# Patient Record
Sex: Female | Born: 1980
Health system: Southern US, Community
[De-identification: ages and names within clinical notes are randomized; demographics above are authoritative.]

## PROBLEM LIST (undated history)

## (undated) DIAGNOSIS — F419 Anxiety disorder, unspecified: Secondary | ICD-10-CM

## (undated) DIAGNOSIS — N200 Calculus of kidney: Secondary | ICD-10-CM

## (undated) DIAGNOSIS — I10 Essential (primary) hypertension: Secondary | ICD-10-CM

## (undated) DIAGNOSIS — F32A Depression, unspecified: Secondary | ICD-10-CM

## (undated) HISTORY — DX: Anxiety disorder, unspecified: F41.9

## (undated) HISTORY — DX: Depression, unspecified: F32.A

## (undated) HISTORY — DX: Calculus of kidney: N20.0

---
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
## 1998-04-13 HISTORY — PX: LITHOTRIPSY: SUR834

## 1998-06-13 ENCOUNTER — Emergency Department (HOSPITAL_COMMUNITY): Admission: EM | Admit: 1998-06-13 | Discharge: 1998-06-13 | Payer: Self-pay | Admitting: Emergency Medicine

## 1998-06-13 ENCOUNTER — Encounter: Payer: Self-pay | Admitting: Emergency Medicine

## 1998-09-16 ENCOUNTER — Other Ambulatory Visit: Admission: RE | Admit: 1998-09-16 | Discharge: 1998-09-16 | Payer: Self-pay | Admitting: Obstetrics and Gynecology

## 2000-01-09 ENCOUNTER — Emergency Department (HOSPITAL_COMMUNITY): Admission: EM | Admit: 2000-01-09 | Discharge: 2000-01-09 | Payer: Self-pay | Admitting: Emergency Medicine

## 2000-03-26 ENCOUNTER — Inpatient Hospital Stay (HOSPITAL_COMMUNITY): Admission: AD | Admit: 2000-03-26 | Discharge: 2000-03-26 | Payer: Self-pay | Admitting: Obstetrics and Gynecology

## 2000-03-27 ENCOUNTER — Inpatient Hospital Stay (HOSPITAL_COMMUNITY): Admission: AD | Admit: 2000-03-27 | Discharge: 2000-03-29 | Payer: Self-pay | Admitting: Obstetrics and Gynecology

## 2000-04-28 ENCOUNTER — Other Ambulatory Visit: Admission: RE | Admit: 2000-04-28 | Discharge: 2000-04-28 | Payer: Self-pay | Admitting: Obstetrics and Gynecology

## 2000-10-10 ENCOUNTER — Emergency Department (HOSPITAL_COMMUNITY): Admission: EM | Admit: 2000-10-10 | Discharge: 2000-10-10 | Payer: Self-pay | Admitting: Emergency Medicine

## 2000-12-12 ENCOUNTER — Emergency Department (HOSPITAL_COMMUNITY): Admission: EM | Admit: 2000-12-12 | Discharge: 2000-12-12 | Payer: Self-pay | Admitting: Emergency Medicine

## 2001-04-19 ENCOUNTER — Other Ambulatory Visit: Admission: RE | Admit: 2001-04-19 | Discharge: 2001-04-19 | Payer: Self-pay | Admitting: Obstetrics and Gynecology

## 2001-07-27 ENCOUNTER — Encounter: Admission: RE | Admit: 2001-07-27 | Discharge: 2001-07-27 | Payer: Self-pay | Admitting: Urology

## 2001-07-27 ENCOUNTER — Encounter: Payer: Self-pay | Admitting: Urology

## 2001-08-22 ENCOUNTER — Encounter: Payer: Self-pay | Admitting: Urology

## 2001-08-22 ENCOUNTER — Ambulatory Visit (HOSPITAL_BASED_OUTPATIENT_CLINIC_OR_DEPARTMENT_OTHER): Admission: RE | Admit: 2001-08-22 | Discharge: 2001-08-22 | Payer: Self-pay | Admitting: Urology

## 2001-09-08 ENCOUNTER — Encounter: Payer: Self-pay | Admitting: Urology

## 2001-09-08 ENCOUNTER — Encounter: Admission: RE | Admit: 2001-09-08 | Discharge: 2001-09-08 | Payer: Self-pay | Admitting: Urology

## 2002-05-17 ENCOUNTER — Other Ambulatory Visit: Admission: RE | Admit: 2002-05-17 | Discharge: 2002-05-17 | Payer: Self-pay | Admitting: Obstetrics and Gynecology

## 2003-06-27 ENCOUNTER — Other Ambulatory Visit: Admission: RE | Admit: 2003-06-27 | Discharge: 2003-06-27 | Payer: Self-pay | Admitting: Obstetrics and Gynecology

## 2003-09-26 ENCOUNTER — Emergency Department (HOSPITAL_COMMUNITY): Admission: EM | Admit: 2003-09-26 | Discharge: 2003-09-26 | Payer: Self-pay | Admitting: Family Medicine

## 2004-08-13 ENCOUNTER — Other Ambulatory Visit: Admission: RE | Admit: 2004-08-13 | Discharge: 2004-08-13 | Payer: Self-pay | Admitting: Obstetrics and Gynecology

## 2005-03-11 ENCOUNTER — Other Ambulatory Visit: Admission: RE | Admit: 2005-03-11 | Discharge: 2005-03-11 | Payer: Self-pay | Admitting: Obstetrics and Gynecology

## 2007-04-14 HISTORY — PX: INTRAUTERINE DEVICE INSERTION: SHX323

## 2007-08-07 ENCOUNTER — Inpatient Hospital Stay (HOSPITAL_COMMUNITY): Admission: AD | Admit: 2007-08-07 | Discharge: 2007-08-09 | Payer: Self-pay | Admitting: Obstetrics and Gynecology

## 2007-08-31 ENCOUNTER — Ambulatory Visit: Admission: RE | Admit: 2007-08-31 | Discharge: 2007-08-31 | Payer: Self-pay | Admitting: Obstetrics and Gynecology

## 2010-07-22 ENCOUNTER — Ambulatory Visit (HOSPITAL_COMMUNITY)
Admission: RE | Admit: 2010-07-22 | Discharge: 2010-07-22 | Disposition: A | Discharge status: Home / Self Care | Payer: 59 | Attending: Psychiatry | Admitting: Psychiatry

## 2010-07-22 DIAGNOSIS — F39 Unspecified mood [affective] disorder: Secondary | ICD-10-CM | POA: Insufficient documentation

## 2011-01-06 LAB — CBC
HCT: 33.2 — ABNORMAL LOW
HCT: 40
Hemoglobin: 11.7 — ABNORMAL LOW
Hemoglobin: 14
MCHC: 35
MCHC: 35.2
MCV: 93.9
MCV: 94.4
Platelets: 120 — ABNORMAL LOW
Platelets: 129 — ABNORMAL LOW
RBC: 3.52 — ABNORMAL LOW
RBC: 4.26
RDW: 13
RDW: 13
WBC: 11.4 — ABNORMAL HIGH
WBC: 12.9 — ABNORMAL HIGH

## 2011-01-06 LAB — RPR: RPR Ser Ql: NONREACTIVE

## 2011-04-01 ENCOUNTER — Ambulatory Visit (INDEPENDENT_AMBULATORY_CARE_PROVIDER_SITE_OTHER): Payer: BC Managed Care – PPO

## 2011-04-01 DIAGNOSIS — B9789 Other viral agents as the cause of diseases classified elsewhere: Secondary | ICD-10-CM

## 2011-04-01 DIAGNOSIS — J029 Acute pharyngitis, unspecified: Secondary | ICD-10-CM

## 2011-07-21 ENCOUNTER — Encounter (INDEPENDENT_AMBULATORY_CARE_PROVIDER_SITE_OTHER): Payer: Self-pay | Admitting: Surgery

## 2011-07-30 ENCOUNTER — Encounter (INDEPENDENT_AMBULATORY_CARE_PROVIDER_SITE_OTHER): Payer: Self-pay | Admitting: Surgery

## 2011-07-30 ENCOUNTER — Ambulatory Visit (INDEPENDENT_AMBULATORY_CARE_PROVIDER_SITE_OTHER): Payer: BC Managed Care – PPO | Admitting: Surgery

## 2011-07-30 VITALS — BP 124/78 | HR 78 | Temp 98.6°F | Resp 16 | Ht 71.0 in | Wt 202.0 lb

## 2011-07-30 DIAGNOSIS — L723 Sebaceous cyst: Secondary | ICD-10-CM | POA: Insufficient documentation

## 2011-07-30 DIAGNOSIS — L089 Local infection of the skin and subcutaneous tissue, unspecified: Secondary | ICD-10-CM | POA: Insufficient documentation

## 2011-07-30 NOTE — Progress Notes (Signed)
Patient ID: Stacy Bray, female   DOB: [birth year], 31 y.o.   MRN: [id number]  No chief complaint on file.   HPI Stacy Bray is a 31 y.o. female.  Pt referred by Cher Nakai at [facility] for neck mass and scalp lesion HPI 31 yo female in good health presents with 5 month history of a slowly enlarging skin lesion on her scalp and a 1 year history of an enlarging mass on her neck.  Neither have been infected or inflamed.  The neck mass is just under the skin and is fairly mobile.  No past medical history on file.  Past Surgical History  Procedure Date  . Lithotripsy 2000    kidney stones  . Intrauterine device insertion 2009    Family History  Problem Relation Age of Onset  . Cancer Mother     colon cancer  . Cancer Maternal Aunt     melanoma, bladder cancer  . Cancer Maternal Grandmother     Breast Cancer  . Cancer Maternal Grandfather     colon cancer    Social History History  Substance Use Topics  . Smoking status: Former Smoker -- 1.0 packs/day    Types: Cigarettes    Quit date: 05/01/2011  . Smokeless tobacco: Not on file  . Alcohol Use: Yes    No Known Allergies  No current outpatient prescriptions on file.    Review of Systems Review of Systems ROS - otherwise negative Blood pressure 124/78, pulse 78, temperature 98.6 F (37 C), temperature source Temporal, resp. rate 16, height 5\' 11"  (1.803 m), weight 202 lb (91.627 kg).  Physical Exam Physical Exam WDWN in NAD Scalp - just to the left of midline on the crown of her scalp, there is a whitish papular skin lesion.  No drainage, crusting, or inflammation Left neck - at base - 1 cm subcutaneous mass; non-inflamed; no drainage  Data Reviewed none  Assessment    1.  Sebaceous cyst - left neck  2.  Skin lesion - scalp    Plan    Excision of sebaceous cyst and skin biopsy in the office.  The surgical procedure has been discussed with the patient.  Potential risks, benefits,  alternative treatments, and expected outcomes have been explained.  All of the patient's questions at this time have been answered.  The likelihood of reaching the patient's treatment goal is good.  The patient understand the proposed surgical procedure and wishes to proceed.        TSUEI,MATTHEW K. 07/30/2011, 4:22 PM

## 2011-08-12 ENCOUNTER — Encounter (INDEPENDENT_AMBULATORY_CARE_PROVIDER_SITE_OTHER): Payer: Self-pay | Admitting: Surgery

## 2011-08-12 ENCOUNTER — Ambulatory Visit (INDEPENDENT_AMBULATORY_CARE_PROVIDER_SITE_OTHER): Payer: BC Managed Care – PPO | Admitting: Surgery

## 2011-08-12 VITALS — BP 117/85 | HR 89 | Temp 98.2°F | Ht 71.0 in | Wt 198.8 lb

## 2011-08-12 DIAGNOSIS — D234 Other benign neoplasm of skin of scalp and neck: Secondary | ICD-10-CM

## 2011-08-12 DIAGNOSIS — L989 Disorder of the skin and subcutaneous tissue, unspecified: Secondary | ICD-10-CM | POA: Insufficient documentation

## 2011-08-12 DIAGNOSIS — L723 Sebaceous cyst: Secondary | ICD-10-CM

## 2011-08-12 NOTE — Progress Notes (Signed)
The patient presents for excision of a sebaceous cyst on the left side of her neck ( 1 cm) and a skin lesion of the scalp (0.5 cm).  She was positioned in a supine position on the procedure table with her head turned to the right.  Her neck was prepped with Betadine and the skin was anesthetized with 1% lidocaine.  A 1cm incision was made over the mass.  I dissected the sebaceous cyst out completely.  The wound was closed with 4-0 monocryl and sealed with Dermabond.  The scalp skin lesion was prepped with alcohol and anesthetized with lidocaine.  I shaved the lesion off flush with the skin.  Hemostasis was obtained with pressure and silver nitrate.  The skin lesion was sent for pathologic examination.  We will call the patient with her results.  Follow-up PRN.  Wilmon Arms. Corliss Skains, MD, [facility]  08/12/2011 5:34 PM

## 2011-08-17 ENCOUNTER — Telehealth (INDEPENDENT_AMBULATORY_CARE_PROVIDER_SITE_OTHER): Payer: Self-pay | Admitting: General Surgery

## 2011-08-17 NOTE — Telephone Encounter (Signed)
Called pt let her now the results of her path report

## 2011-09-02 ENCOUNTER — Other Ambulatory Visit: Payer: Self-pay | Admitting: Family Medicine

## 2011-11-24 ENCOUNTER — Other Ambulatory Visit: Payer: Self-pay | Admitting: Obstetrics & Gynecology

## 2012-07-21 ENCOUNTER — Other Ambulatory Visit: Payer: Self-pay

## 2014-08-09 ENCOUNTER — Ambulatory Visit (INDEPENDENT_AMBULATORY_CARE_PROVIDER_SITE_OTHER): Payer: 59 | Admitting: Podiatry

## 2014-08-09 ENCOUNTER — Encounter: Payer: Self-pay | Admitting: Podiatry

## 2014-08-09 ENCOUNTER — Ambulatory Visit: Payer: Self-pay

## 2014-08-09 DIAGNOSIS — M2010 Hallux valgus (acquired), unspecified foot: Secondary | ICD-10-CM | POA: Diagnosis not present

## 2014-08-09 DIAGNOSIS — M722 Plantar fascial fibromatosis: Secondary | ICD-10-CM

## 2014-08-09 DIAGNOSIS — B351 Tinea unguium: Secondary | ICD-10-CM | POA: Diagnosis not present

## 2014-08-09 MED ORDER — TERBINAFINE HCL 250 MG PO TABS: 250.0000 mg | ORAL_TABLET | Freq: Every day | ORAL | Status: DC

## 2014-08-09 MED ORDER — DICLOFENAC SODIUM 75 MG PO TBEC: 75.0000 mg | DELAYED_RELEASE_TABLET | Freq: Two times a day (BID) | ORAL | Status: DC

## 2014-08-09 MED ORDER — TRIAMCINOLONE ACETONIDE 10 MG/ML IJ SUSP
10.0000 mg | Freq: Once | INTRAMUSCULAR | Status: AC
  Administered 2014-08-09: 10 mg

## 2014-08-09 NOTE — Progress Notes (Signed)
   Subjective:    Patient ID: [name], female    DOB: 1980/07/20, 34 y.o.   MRN: [id number]  HPI PT STATED LT BOTTOM OF THE HEEL BEEN HURTING FOR FOR 6 MONTHS. FOOT IS GETTING WORSE ESPECIALLY FIRST STEP IN THE MORNING. TRIED STRETCHING AND IT HELP SOME.   Review of Systems  All other systems reviewed and are negative.      Objective:   Physical Exam        Assessment & Plan:

## 2014-08-09 NOTE — Patient Instructions (Signed)
Plantar Fasciitis  Plantar fasciitis is a common condition that causes foot pain. It is soreness (inflammation) of the band of tough fibrous tissue on the bottom of the foot that runs from the heel bone (calcaneus) to the ball of the foot. The cause of this soreness may be from excessive standing, poor fitting shoes, running on hard surfaces, being overweight, having an abnormal walk, or overuse (this is common in runners) of the painful foot or feet. It is also common in aerobic exercise dancers and ballet dancers.  SYMPTOMS   Most people with plantar fasciitis complain of:   Severe pain in the morning on the bottom of their foot especially when taking the first steps out of bed. This pain recedes after a few minutes of walking.   Severe pain is experienced also during walking following a long period of inactivity.   Pain is worse when walking barefoot or up stairs  DIAGNOSIS    Your caregiver will diagnose this condition by examining and feeling your foot.   Special tests such as X-rays of your foot, are usually not needed.  PREVENTION    Consult a sports medicine professional before beginning a new exercise program.   Walking programs offer a good workout. With walking there is a lower chance of overuse injuries common to runners. There is less impact and less jarring of the joints.   Begin all new exercise programs slowly. If problems or pain develop, decrease the amount of time or distance until you are at a comfortable level.   Wear good shoes and replace them regularly.   Stretch your foot and the heel cords at the back of the ankle (Achilles tendon) both before and after exercise.   Run or exercise on even surfaces that are not hard. For example, asphalt is better than pavement.   Do not run barefoot on hard surfaces.   If using a treadmill, vary the incline.   Do not continue to workout if you have foot or joint problems. Seek professional help if they do not improve.  HOME CARE INSTRUCTIONS     Avoid activities that cause you pain until you recover.   Use ice or cold packs on the problem or painful areas after working out.   Only take over-the-counter or prescription medicines for pain, discomfort, or fever as directed by your caregiver.   Soft shoe inserts or athletic shoes with air or gel sole cushions may be helpful.   If problems continue or become more severe, consult a sports medicine caregiver or your own health care provider. Cortisone is a potent anti-inflammatory medication that may be injected into the painful area. You can discuss this treatment with your caregiver.  MAKE SURE YOU:    Understand these instructions.   Will watch your condition.   Will get help right away if you are not doing well or get worse.  Document Released: 12/23/2000 Document Revised: 06/22/2011 Document Reviewed: 02/22/2008  ExitCare Patient Information 2015 [employer]. This information is not intended to replace advice given to you by your health care provider. Make sure you discuss any questions you have with your health care provider.

## 2014-08-10 NOTE — Progress Notes (Signed)
Subjective:     Patient ID: Stacy Bray, female   DOB: 12-23-1980, 34 y.o.   MRN: [id number]  HPI patient states that she's getting a lot of pain in the plantar aspect of her left heel of approximate 6 months duration. It's worse first up in the morning and after sitting. Patient states that it has gotten gradually worse and she also complains of a mild structural bunion deformity right   Review of Systems  All other systems reviewed and are negative.      Objective:   Physical Exam  Constitutional: She is oriented to person, place, and time.  Cardiovascular: Intact distal pulses.   Musculoskeletal: Normal range of motion.  Neurological: She is oriented to person, place, and time.  Skin: Skin is warm.  Nursing note and vitals reviewed.  neurovascular status found to be intact with muscle strength adequate and range of motion subtalar midtarsal joint within normal limits. Patient's noted to have quite a bit of discomfort plantar aspect left heel at the insertion of the tendon into the calcaneus with inflammation and fluid buildup upon palpation. Patient has good digital perfusion is well oriented 3 and on the right foot is noted to have a mild hyperostosis that red when pressed but minimal discomfort     Assessment:     Acute plantar fasciitis left and mild structural bunion deformity right    Plan:     H&P and x-rays reviewed. Today I injected the left plantar fashion 3 mg Kenalog 5 mg Xylocaine and applied fascial brace to lift the arch and placed on diclofenac 75 mg twice a day and gave instructions on physical therapy to patient. Reappoint to recheck in one week

## 2014-11-01 ENCOUNTER — Other Ambulatory Visit: Payer: Self-pay | Admitting: Obstetrics and Gynecology

## 2015-03-11 ENCOUNTER — Encounter: Payer: Self-pay | Admitting: Internal Medicine

## 2015-10-07 ENCOUNTER — Other Ambulatory Visit: Payer: Self-pay | Admitting: Surgery

## 2015-10-07 DIAGNOSIS — R590 Localized enlarged lymph nodes: Secondary | ICD-10-CM | POA: Insufficient documentation

## 2015-10-09 ENCOUNTER — Ambulatory Visit
Admission: RE | Admit: 2015-10-09 | Discharge: 2015-10-09 | Disposition: A | Discharge status: Home / Self Care | Payer: 59 | Source: Ambulatory Visit | Attending: Surgery | Admitting: Surgery

## 2015-10-09 DIAGNOSIS — R590 Localized enlarged lymph nodes: Secondary | ICD-10-CM

## 2015-12-21 ENCOUNTER — Other Ambulatory Visit: Payer: Self-pay | Admitting: General Surgery

## 2015-12-21 DIAGNOSIS — R590 Localized enlarged lymph nodes: Secondary | ICD-10-CM

## 2015-12-31 ENCOUNTER — Ambulatory Visit
Admission: RE | Admit: 2015-12-31 | Discharge: 2015-12-31 | Disposition: A | Discharge status: Home / Self Care | Payer: 59 | Source: Ambulatory Visit | Attending: General Surgery | Admitting: General Surgery

## 2015-12-31 DIAGNOSIS — R590 Localized enlarged lymph nodes: Secondary | ICD-10-CM

## 2016-01-30 ENCOUNTER — Other Ambulatory Visit (HOSPITAL_COMMUNITY)
Admission: RE | Admit: 2016-01-30 | Discharge: 2016-01-30 | Disposition: A | Discharge status: Home / Self Care | Payer: 59 | Source: Ambulatory Visit | Attending: Otolaryngology | Admitting: Otolaryngology

## 2016-01-30 ENCOUNTER — Other Ambulatory Visit: Payer: Self-pay | Admitting: Otolaryngology

## 2016-09-02 DIAGNOSIS — Z01419 Encounter for gynecological examination (general) (routine) without abnormal findings: Secondary | ICD-10-CM | POA: Diagnosis not present

## 2016-09-14 DIAGNOSIS — E669 Obesity, unspecified: Secondary | ICD-10-CM | POA: Insufficient documentation

## 2017-02-17 DIAGNOSIS — A084 Viral intestinal infection, unspecified: Secondary | ICD-10-CM | POA: Diagnosis not present

## 2017-02-17 DIAGNOSIS — R51 Headache: Secondary | ICD-10-CM | POA: Diagnosis not present

## 2017-09-07 ENCOUNTER — Ambulatory Visit (HOSPITAL_COMMUNITY)
Admission: EM | Admit: 2017-09-07 | Discharge: 2017-09-07 | Disposition: A | Discharge status: Home / Self Care | Payer: 59 | Attending: Family Medicine | Admitting: Family Medicine

## 2017-09-07 ENCOUNTER — Encounter (HOSPITAL_COMMUNITY): Payer: Self-pay | Admitting: Emergency Medicine

## 2017-09-07 DIAGNOSIS — G5601 Carpal tunnel syndrome, right upper limb: Secondary | ICD-10-CM

## 2017-09-07 DIAGNOSIS — M7542 Impingement syndrome of left shoulder: Secondary | ICD-10-CM

## 2017-09-07 DIAGNOSIS — M25512 Pain in left shoulder: Secondary | ICD-10-CM

## 2017-09-07 MED ORDER — NAPROXEN 500 MG PO TABS: 500.0000 mg | ORAL_TABLET | Freq: Two times a day (BID) | ORAL | 1 refills | Status: DC

## 2017-09-07 NOTE — Discharge Instructions (Addendum)
Ice to shoulder Naproxen 2 x a day with food Shoulder exercises Need orthopedic if fails to improve

## 2017-09-07 NOTE — ED Triage Notes (Signed)
Pt sts left arm pain with numbness x 1 week

## 2017-09-07 NOTE — ED Provider Notes (Signed)
Stacy Bray    CSN: [id number] Arrival date & time: 09/07/17  1656     History   Chief Complaint Chief Complaint  Patient presents with  . Arm Pain    HPI Stacy Bray is a 37 y.o. female.   HPI Patient is here with 2 complaints.  She states that her right hand is been going numb on and off for months.  She looked it up in a medical reference and feels that she may have carpal tunnel syndrome.  She is been wearing a brace at work to help with this.  She states the pain sometimes awakens her at night.  I told her that it would be more useful to wear the brace at night if she has carpal tunnel syndrome.  She does describe numbness in the thumb index and long fingers that is more bothersome at night than during the day.  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 The other reason she is here, more importantly, as her left shoulder pain.  This is been bothering her for about a week.  She states that she has pain in her shoulder and neck, limited movement of her shoulder, and pain that radiates down to the elbow.  Sometimes she has a heavy sensation or numb sensation in her arm.  No numbness or weakness in the forearm wrist or hands.  No loss of strength.  No loss of dexterity.  No overuse of the shoulder.  No prior problems with the shoulder.  No fall or trauma.  History reviewed. No pertinent past medical history.  Patient Active Problem List   Diagnosis Date Noted  . Left cervical lymphadenopathy 10/07/2015  . Skin lesion of scalp 08/12/2011  . Sebaceous cyst 07/30/2011    Past Surgical History:  Procedure Laterality Date  . INTRAUTERINE DEVICE INSERTION  2009  . LITHOTRIPSY  2000   kidney stones    OB History   None      Home Medications    Prior to Admission medications   Medication Sig Start Date End Date Taking? Authorizing Provider  naproxen (NAPROSYN) 500 MG tablet Take 1 tablet (500 mg  total) by mouth 2 (two) times daily. 09/07/17   Raylene Everts, MD    Family History Family History  Problem Relation Age of Onset  . Cancer Mother        colon cancer  . Cancer Maternal Aunt        melanoma, bladder cancer  . Cancer Maternal Grandmother        Breast Cancer  . Cancer Maternal Grandfather        colon cancer    Social History Social History   Tobacco Use  . Smoking status: Former Smoker    Packs/day: 1.00    Types: Cigarettes    Last attempt to quit: 05/01/2011    Years since quitting: 6.3  Substance Use Topics  . Alcohol use: Yes  . Drug use: No     Allergies   Patient has no known allergies.   Review of Systems Review of Systems  Constitutional: Negative for chills and fever.  HENT: Negative for ear pain and sore throat.   Eyes: Negative for pain and visual disturbance.  Respiratory: Negative for cough and shortness of breath.   Cardiovascular: Negative for chest pain and palpitations.  Gastrointestinal: Negative for abdominal pain and vomiting.  Genitourinary: Negative for dysuria and hematuria.  Musculoskeletal: Positive for  arthralgias. Negative for back pain.  Skin: Negative for color change and rash.  Neurological: Positive for numbness. Negative for seizures and syncope.  Psychiatric/Behavioral: Positive for sleep disturbance.  All other systems reviewed and are negative.    Physical Exam Triage Vital Signs ED Triage Vitals [09/07/17 1744]  Enc Vitals Group     BP 140/78     Pulse Rate 64     Resp 18     Temp 98.8 F (37.1 C)     Temp Source Oral     SpO2 100 %     Weight      Height      Head Circumference      Peak Flow      Pain Score      Pain Loc      Pain Edu?      Excl. in [place]?    No data found.  Updated Vital Signs BP 140/78 (BP Location: Left Arm)   Pulse 64   Temp 98.8 F (37.1 C) (Oral)   Resp 18   SpO2 100%   Visual Acuity Right Eye Distance:   Left Eye Distance:   Bilateral Distance:    Right  Eye Near:   Left Eye Near:    Bilateral Near:     Physical Exam  Constitutional: She appears well-developed and well-nourished. No distress.  HENT:  Head: Normocephalic and atraumatic.  Mouth/Throat: Oropharynx is clear and moist.  Eyes: Pupils are equal, round, and reactive to light. Conjunctivae are normal.  Neck: Normal range of motion.  Cardiovascular: Normal rate.  Pulmonary/Chest: Effort normal. No respiratory distress.  Abdominal: Soft. She exhibits no distension.  Musculoskeletal: Normal range of motion. She exhibits no edema.  Neck is full range of motion.  No tenderness.  Negative Spurling's bilaterally.  Right upper extremity does reveal normal strength sensation range of motion reflexes.  Positive Phalen's at 45 seconds.  Left upper trapezius is mildly tender.  There is tenderness globally around the shoulder joint.  She can abduct to 80 degrees.  Forward flex to 90 degrees.  Pain with external rotation, difficulty reaching behind head.  Mild positive impingement signs.  Elbow wrist and hand exams are normal.  Neurological: She is alert.  Skin: Skin is warm and dry.     UC Treatments / Results  Labs (all labs ordered are listed, but only abnormal results are displayed) Labs Reviewed - No data to display  EKG None  Radiology No results found.  Procedures Procedures (including critical care time)  Medications Ordered in UC Medications - No data to display  Initial Impression / Assessment and Plan / UC Course  I have reviewed the triage vital signs and the nursing notes.  Pertinent labs & imaging results that were available during my care of the patient were reviewed by me and considered in my medical decision making (see chart for details).     Discussed with patient that her symptoms are consistent with early carpal tunnel syndrome on the right.  Reasons for referral are discussed.  At this point she can treat conservatively.  Left shoulder pain is consistent  with shoulder impingement/tendinopathy.  Likely not torn since she has good strength, had no trauma.  She should treat this with anti-inflammatories, rest, ice.  Then range of motion and strengthening.  May need follow-up with orthopedic if fails to improve. Final Clinical Impressions(s) / UC Diagnoses   Final diagnoses:  Nontraumatic shoulder pain, left  Rotator cuff impingement syndrome of  left shoulder  Carpal tunnel syndrome on right     Discharge Instructions     Ice to shoulder Naproxen 2 x a day with food Shoulder exercises Need orthopedic if fails to improve   ED Prescriptions    Medication Sig Dispense Auth. Provider   naproxen (NAPROSYN) 500 MG tablet Take 1 tablet (500 mg total) by mouth 2 (two) times daily. 30 tablet Raylene Everts, MD     Controlled Substance Prescriptions [place] Controlled Substance Registry consulted? Not Applicable   Raylene Everts, MD 09/07/17 2128

## 2017-09-15 DIAGNOSIS — Z01419 Encounter for gynecological examination (general) (routine) without abnormal findings: Secondary | ICD-10-CM | POA: Diagnosis not present

## 2017-09-15 DIAGNOSIS — Z124 Encounter for screening for malignant neoplasm of cervix: Secondary | ICD-10-CM | POA: Diagnosis not present

## 2017-10-19 DIAGNOSIS — Z131 Encounter for screening for diabetes mellitus: Secondary | ICD-10-CM | POA: Diagnosis not present

## 2017-10-19 DIAGNOSIS — Z1329 Encounter for screening for other suspected endocrine disorder: Secondary | ICD-10-CM | POA: Diagnosis not present

## 2017-10-19 DIAGNOSIS — Z13220 Encounter for screening for lipoid disorders: Secondary | ICD-10-CM | POA: Diagnosis not present

## 2017-10-19 DIAGNOSIS — Z3202 Encounter for pregnancy test, result negative: Secondary | ICD-10-CM | POA: Diagnosis not present

## 2017-10-19 DIAGNOSIS — R718 Other abnormality of red blood cells: Secondary | ICD-10-CM | POA: Diagnosis not present

## 2017-11-07 ENCOUNTER — Ambulatory Visit (HOSPITAL_COMMUNITY)
Admission: EM | Admit: 2017-11-07 | Discharge: 2017-11-07 | Disposition: A | Discharge status: Home / Self Care | Payer: 59

## 2017-11-07 ENCOUNTER — Ambulatory Visit (HOSPITAL_COMMUNITY): Payer: 59

## 2017-11-07 ENCOUNTER — Encounter (HOSPITAL_COMMUNITY): Payer: Self-pay | Admitting: Emergency Medicine

## 2017-11-07 ENCOUNTER — Other Ambulatory Visit: Payer: Self-pay

## 2017-11-07 ENCOUNTER — Ambulatory Visit (INDEPENDENT_AMBULATORY_CARE_PROVIDER_SITE_OTHER): Payer: 59

## 2017-11-07 DIAGNOSIS — R2241 Localized swelling, mass and lump, right lower limb: Secondary | ICD-10-CM

## 2017-11-07 DIAGNOSIS — S92351A Displaced fracture of fifth metatarsal bone, right foot, initial encounter for closed fracture: Secondary | ICD-10-CM

## 2017-11-07 MED ORDER — HYDROCODONE-ACETAMINOPHEN 5-325 MG PO TABS: 1.0000 | ORAL_TABLET | Freq: Four times a day (QID) | ORAL | 0 refills | Status: DC | PRN

## 2017-11-07 MED ORDER — NAPROXEN SODIUM 220 MG PO TABS: 440.0000 mg | ORAL_TABLET | ORAL | 0 refills | Status: DC

## 2017-11-07 NOTE — Discharge Instructions (Signed)
Call the foot specialist on Monday morning. Wear the splint at all times when resonable.  Okay to continue ice on the splint.  Take two aleve in the morning and tylenol 1000 mg every 8 hours as needed for pain.  Okay to take narcotics at night.

## 2017-11-07 NOTE — ED Triage Notes (Addendum)
Right foot and ankle pain.  Patient twisted awkwardly on stairs , and then fell.  Patient heard and felt something in foot pop.  Foot and ankle is painful, bruised and swollen.  The most painful area is ankle.  Pedal pulse 2 plus, pitting edema.  Patient can move toes, but somewhat numb

## 2017-11-07 NOTE — ED Provider Notes (Signed)
11/07/2017 4:24 PM   DOB: [birth year] / MRN: [id number]  SUBJECTIVE:  Stacy Bray is a 37 y.o. female presenting for foot pain that started after a twisting motion 2 days ago.  She associates swelling. Pain worse with ambulation.  No calf pain or lateral leg pain. Getting worse.   She has No Known Allergies.   She  has no past medical history on file.    She  reports that she quit smoking about 6 years ago. Her smoking use included cigarettes. She smoked 1.00 pack per day. She does not have any smokeless tobacco history on file. She reports that she drinks alcohol. She reports that she does not use drugs. She  reports that she currently engages in sexual activity. She reports using the following method of birth control/protection: IUD. The patient  has a past surgical history that includes Lithotripsy (2000) and Intrauterine device insertion (2009).  Her family history includes Cancer in her maternal aunt, maternal grandfather, maternal grandmother, and mother.  Review of Systems  Constitutional: Negative for chills, diaphoresis and fever.  Respiratory: Negative for shortness of breath.   Cardiovascular: Negative for chest pain, orthopnea and leg swelling.  Gastrointestinal: Negative for nausea.  Musculoskeletal: Positive for joint pain.  Skin: Negative for rash.  Neurological: Negative for dizziness.    OBJECTIVE:  BP 136/89 (BP Location: Left Arm)   Pulse 88   Temp 98 F (36.7 C) (Oral)   Resp 18   LMP 10/30/2017   SpO2 100%   Wt Readings from Last 3 Encounters:  08/12/11 198 lb 12.8 oz (90.2 kg)  07/30/11 202 lb (91.6 kg)   Temp Readings from Last 3 Encounters:  11/07/17 98 F (36.7 C) (Oral)  09/07/17 98.8 F (37.1 C) (Oral)  08/12/11 98.2 F (36.8 C) (Temporal)   BP Readings from Last 3 Encounters:  11/07/17 136/89  09/07/17 140/78  08/12/11 117/85   Pulse Readings from Last 3 Encounters:  11/07/17 88  09/07/17 64  08/12/11 89    Physical Exam    Constitutional: She is oriented to person, place, and time. She appears well-nourished. No distress.  Eyes: Pupils are equal, round, and reactive to light. EOM are normal.  Cardiovascular: Normal rate.  Pulmonary/Chest: Effort normal.  Abdominal: She exhibits no distension.  Musculoskeletal:       Legs:      Feet:  Neurological: She is alert and oriented to person, place, and time. No cranial nerve deficit. Gait normal.  Skin: Skin is dry. She is not diaphoretic.  Psychiatric: She has a normal mood and affect.  Vitals reviewed.   No results found for this or any previous visit (from the past 72 hour(s)).  No results found.  ASSESSMENT AND PLAN:   Closed displaced fracture of fifth metatarsal bone of right foot, initial encounter    Discharge Instructions     Call the foot specialist on Monday morning. Wear the splint at all times when resonable.  Okay to continue ice on the splint.  Take two aleve in the morning and tylenol 1000 mg every 8 hours as needed for pain.  Okay to take narcotics at night.         The patient is advised to call or return to clinic if she does not see an improvement in symptoms, or to seek the care of the closest emergency department if she worsens with the above plan.   Philis Fendt, MHS, PA-C 11/07/2017 4:24 PM   Tereasa Coop, PA-C  11/07/17 1624  

## 2017-11-09 DIAGNOSIS — S92354A Nondisplaced fracture of fifth metatarsal bone, right foot, initial encounter for closed fracture: Secondary | ICD-10-CM | POA: Diagnosis not present

## 2017-11-09 DIAGNOSIS — S93491A Sprain of other ligament of right ankle, initial encounter: Secondary | ICD-10-CM | POA: Diagnosis not present

## 2017-11-16 DIAGNOSIS — J0140 Acute pansinusitis, unspecified: Secondary | ICD-10-CM | POA: Diagnosis not present

## 2017-11-16 DIAGNOSIS — R05 Cough: Secondary | ICD-10-CM | POA: Diagnosis not present

## 2017-11-16 DIAGNOSIS — H66001 Acute suppurative otitis media without spontaneous rupture of ear drum, right ear: Secondary | ICD-10-CM | POA: Diagnosis not present

## 2017-11-18 ENCOUNTER — Ambulatory Visit: Payer: 59 | Admitting: Podiatry

## 2018-05-26 IMAGING — US US SOFT TISSUE HEAD/NECK
1 series · 14 of 25 positions shown · non-contrast
Comparison: None.

CLINICAL DATA: Left palpable anterior neck mass

EXAM:
ULTRASOUND OF HEAD/NECK SOFT TISSUES
TECHNIQUE: Ultrasound examination of the head and neck soft tissues was
performed in the area of clinical concern.

[Series 1: us soft tissue head/neck · 0.07mm/px · 14 of 44 slices shown]
[im 1/44]
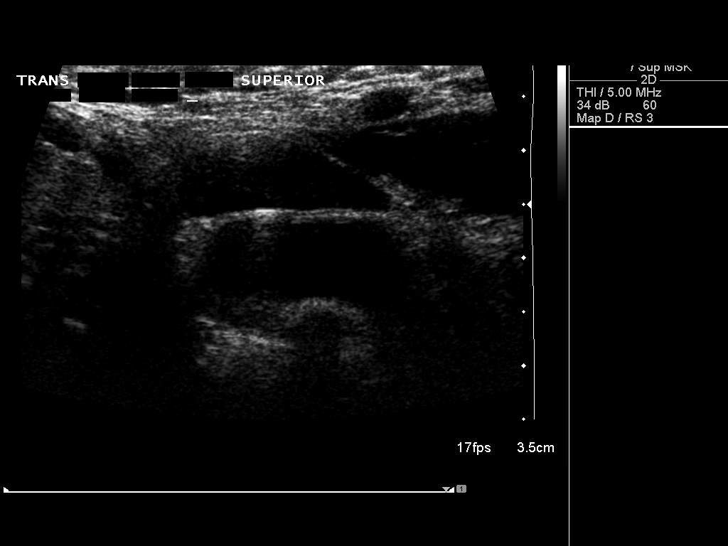
[im 4/44]
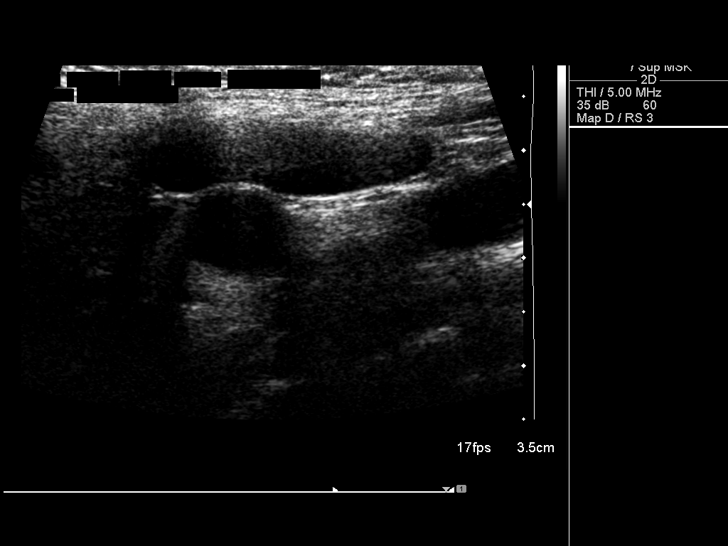
[im 8/44]
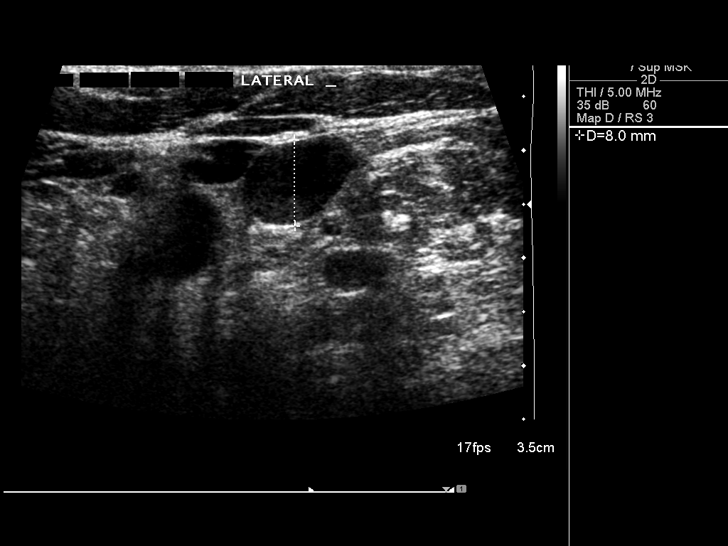
[im 11/44]
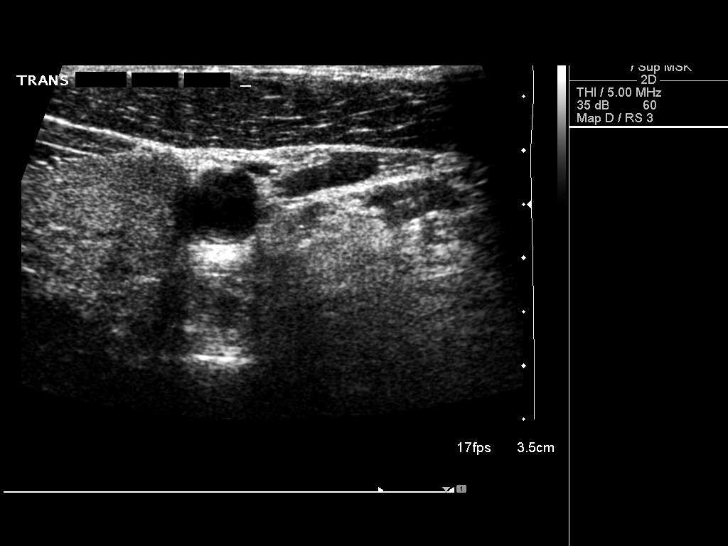
[im 15/44]
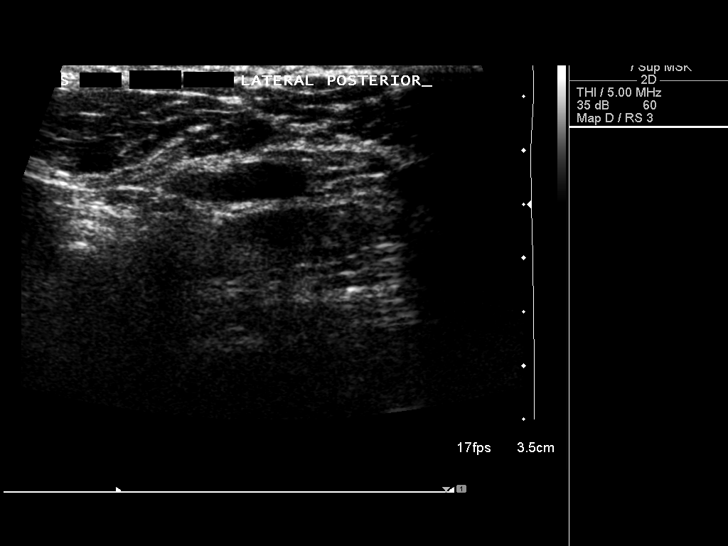
[im 17/44]
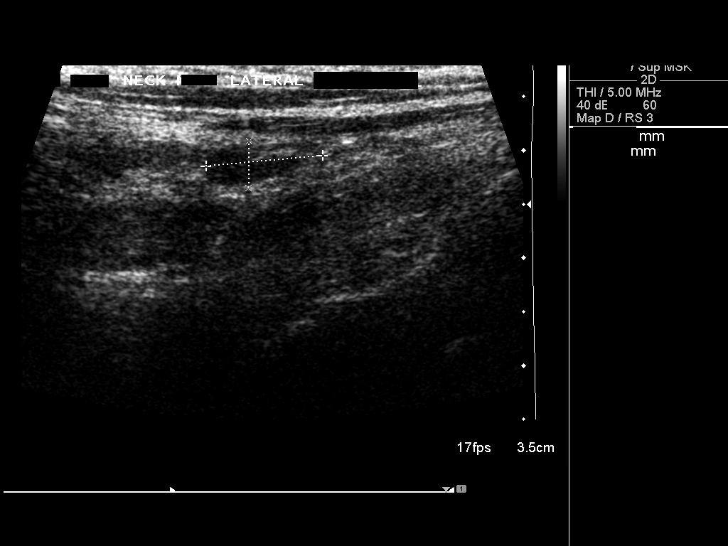
[im 20/44]
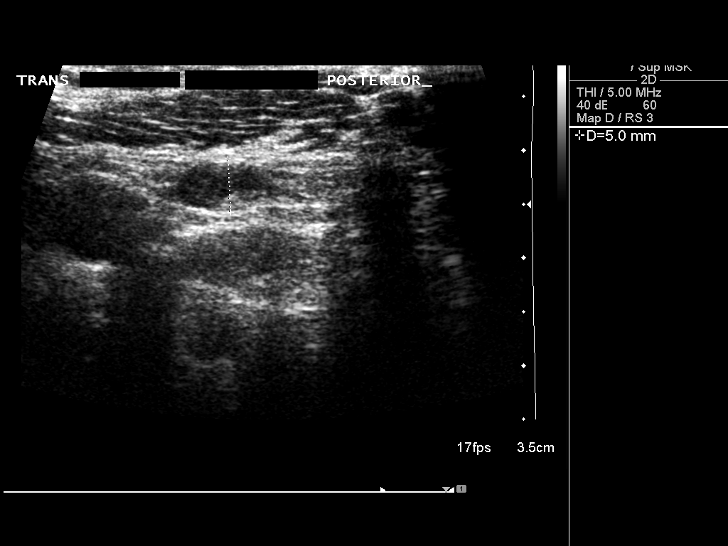
[im 24/44]
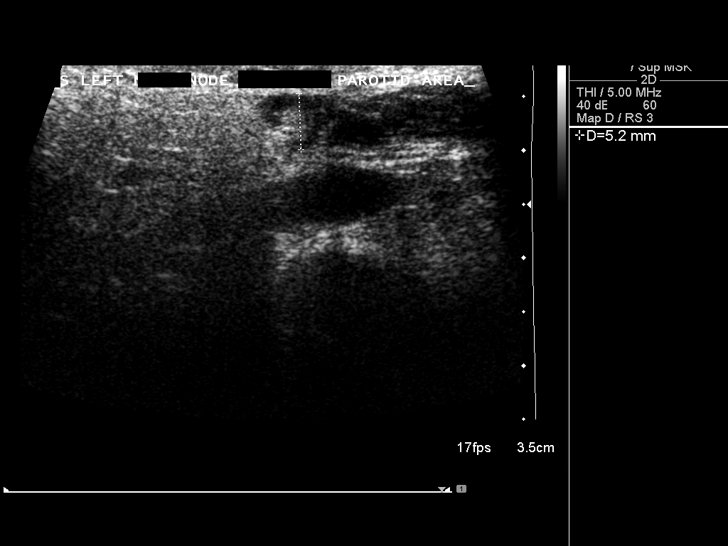
[im 27/44]
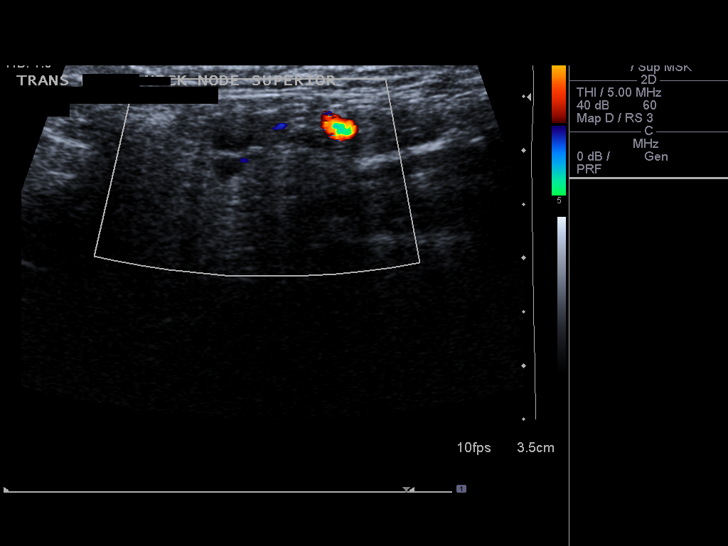
[im 29/44]
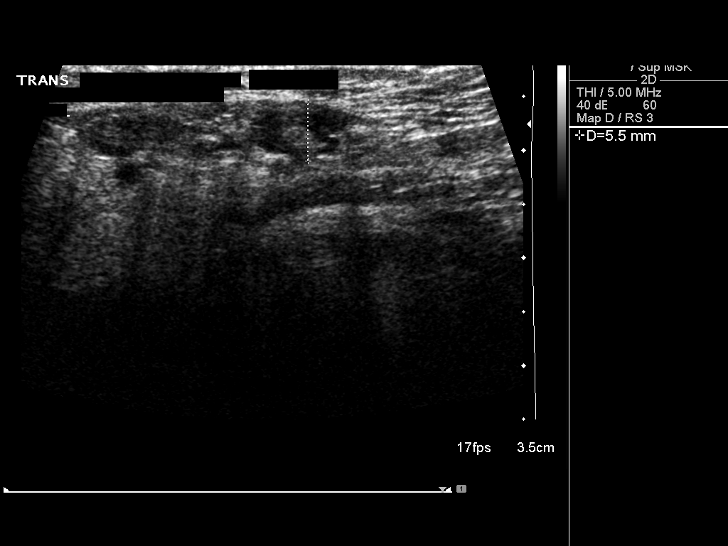
[im 33/44]
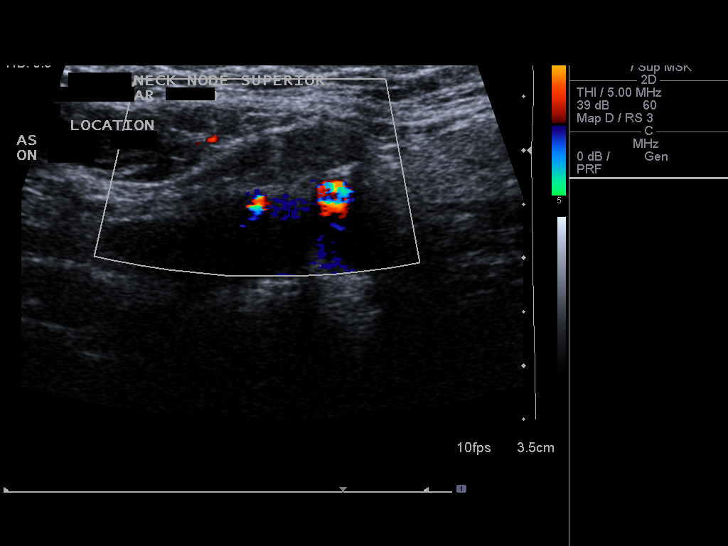
[im 36/44]
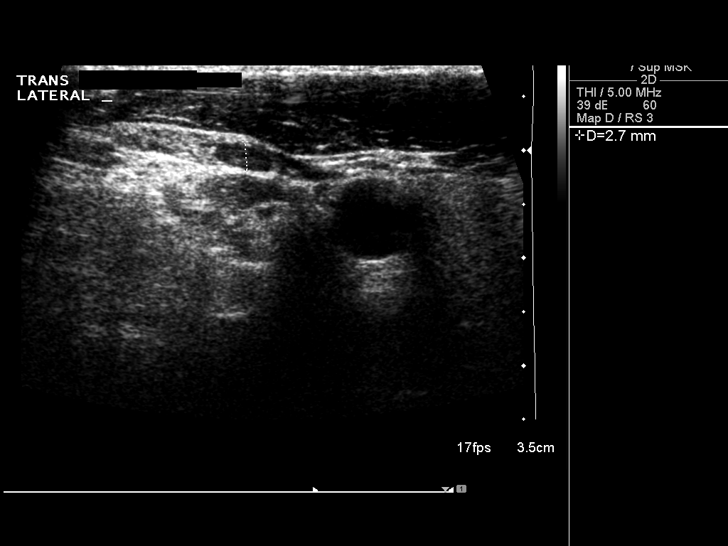
[im 40/44]
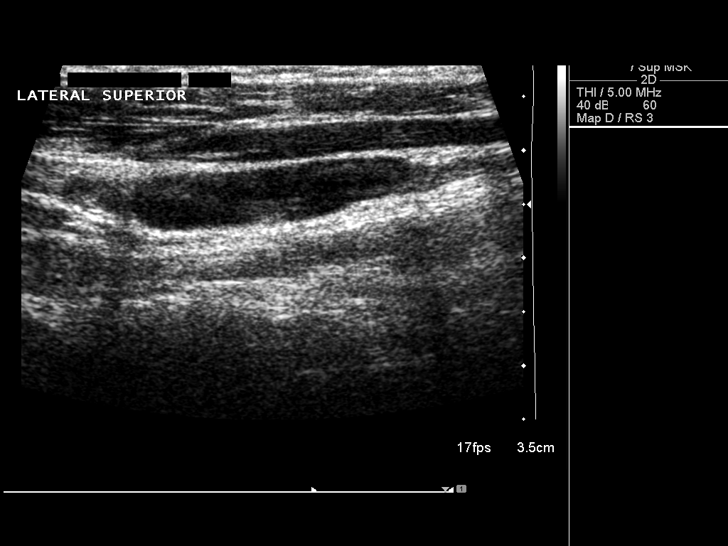
[im 44/44]
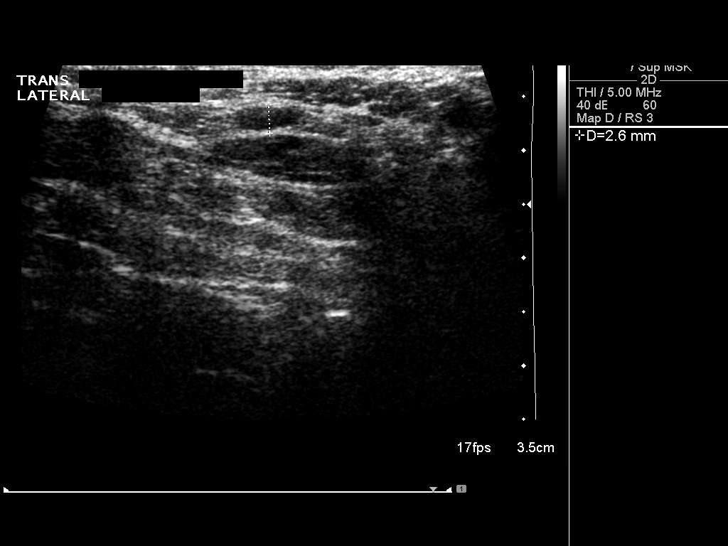

[14 of 25 positions shown; findings below may reference images not displayed]

FINDINGS: Interrogation of the left neck in the region of clinical concern
demonstrates multiple mildly prominent hypoechoic lymph nodes. The
largest lymph node measures up to 9 mm in short axis just inferior
to the mandible. Multiple additional lymph nodes are also evaluated
measuring no more than 5 mm in short axis. Comparison with the right
submandibular region demonstrates a less prominent lymph node
measuring up to 6 mm in short axis. No additional mass or
abnormality identified.
IMPRESSION: Mildly prominent left cervical lymphadenopathy. The lymph nodes are
borderline enlarged by short axis measurement. Favor reactive
lymphadenopathy. An early lymphoproliferative process or metastatic
adenopathy are significantly less likely considerations.

Consider repeat ultrasound in 3 months to assess stability.

## 2018-05-31 DIAGNOSIS — R05 Cough: Secondary | ICD-10-CM | POA: Diagnosis not present

## 2018-05-31 DIAGNOSIS — J309 Allergic rhinitis, unspecified: Secondary | ICD-10-CM | POA: Diagnosis not present

## 2018-05-31 DIAGNOSIS — J029 Acute pharyngitis, unspecified: Secondary | ICD-10-CM | POA: Diagnosis not present

## 2018-07-11 ENCOUNTER — Telehealth: Payer: 59 | Admitting: Family

## 2018-07-11 DIAGNOSIS — R531 Weakness: Secondary | ICD-10-CM

## 2018-07-11 NOTE — Progress Notes (Signed)
Based on what you shared with me, I feel your condition warrants further evaluation and I recommend that you be seen for a face to face office visit.     NOTE: If you entered your credit card information for this eVisit, you will not be charged. You may see a "hold" on your card for the $35 but that hold will drop off and you will not have a charge processed.  If you are having a true medical emergency please call 911.  If you need an urgent face to face visit,  has four urgent care centers for your convenience.    PLEASE NOTE: THE [facility] LOCATIONS AND URGENT CARE CLINICS DO NOT HAVE THE TESTING FOR CORONAVIRUS COVID19 AVAILABLE.  IF YOU FEEL YOU NEED THIS TEST YOU MUST GO TO A TRIAGE LOCATION AT [facility]   [email or web address] to reserve your spot online an avoid wait times  [facility] [address] [place], [place] [address] 8 am to 8 pm Monday-Friday 10 am to 4 pm Saturday-Sunday *Across the street from [employer]  [place], [address] 8 am to 5 pm Monday-Friday * In the [facility] on the [facility]   The following sites will take your insurance:  . [facility] [place] a Provider at this Location  [address] [place], [place] [address] . 10 am to 8 pm Monday-Friday . 12 pm to 8 pm Saturday-Sunday   . [facility] at [place] a Provider at this Location  [address] [place], [place], [place] [address] . 8 am to 8 pm Monday-Friday . 9 am to 6 pm Saturday . 11 am to 6 pm Sunday   . [facility] at [place] Get Driving Directions  [address] [place], [place] [address] . 8 am to 8 pm Monday-Friday . 8 am to 4 pm Saturday-Sunday   Your e-visit answers were  reviewed by a board certified advanced clinical practitioner to complete your personal care plan.  Thank you for using e-Visits.

## 2018-08-02 DIAGNOSIS — K625 Hemorrhage of anus and rectum: Secondary | ICD-10-CM | POA: Diagnosis not present

## 2018-08-02 DIAGNOSIS — K6289 Other specified diseases of anus and rectum: Secondary | ICD-10-CM | POA: Diagnosis not present

## 2018-08-02 DIAGNOSIS — K219 Gastro-esophageal reflux disease without esophagitis: Secondary | ICD-10-CM | POA: Diagnosis not present

## 2018-08-02 DIAGNOSIS — R740 Nonspecific elevation of levels of transaminase and lactic acid dehydrogenase [LDH]: Secondary | ICD-10-CM | POA: Diagnosis not present

## 2018-08-02 DIAGNOSIS — R748 Abnormal levels of other serum enzymes: Secondary | ICD-10-CM | POA: Insufficient documentation

## 2018-08-03 ENCOUNTER — Other Ambulatory Visit: Payer: Self-pay | Admitting: Gastroenterology

## 2018-08-03 DIAGNOSIS — R7989 Other specified abnormal findings of blood chemistry: Secondary | ICD-10-CM

## 2018-08-03 DIAGNOSIS — R945 Abnormal results of liver function studies: Principal | ICD-10-CM

## 2018-09-21 ENCOUNTER — Ambulatory Visit
Admission: RE | Admit: 2018-09-21 | Discharge: 2018-09-21 | Disposition: A | Discharge status: Home / Self Care | Payer: 59 | Source: Ambulatory Visit | Attending: Gastroenterology | Admitting: Gastroenterology

## 2018-09-21 DIAGNOSIS — R7989 Other specified abnormal findings of blood chemistry: Secondary | ICD-10-CM

## 2018-09-29 DIAGNOSIS — R030 Elevated blood-pressure reading, without diagnosis of hypertension: Secondary | ICD-10-CM | POA: Diagnosis not present

## 2018-09-29 DIAGNOSIS — Z1159 Encounter for screening for other viral diseases: Secondary | ICD-10-CM | POA: Diagnosis not present

## 2018-10-18 ENCOUNTER — Other Ambulatory Visit: Payer: Self-pay | Admitting: Obstetrics and Gynecology

## 2018-10-18 DIAGNOSIS — R928 Other abnormal and inconclusive findings on diagnostic imaging of breast: Secondary | ICD-10-CM

## 2018-10-20 DIAGNOSIS — R928 Other abnormal and inconclusive findings on diagnostic imaging of breast: Secondary | ICD-10-CM | POA: Insufficient documentation

## 2018-10-25 DIAGNOSIS — K219 Gastro-esophageal reflux disease without esophagitis: Secondary | ICD-10-CM | POA: Diagnosis not present

## 2018-10-25 DIAGNOSIS — Z800 Family history of malignant neoplasm of digestive organs: Secondary | ICD-10-CM | POA: Diagnosis not present

## 2018-10-25 DIAGNOSIS — R740 Nonspecific elevation of levels of transaminase and lactic acid dehydrogenase [LDH]: Secondary | ICD-10-CM | POA: Diagnosis not present

## 2018-10-27 ENCOUNTER — Ambulatory Visit
Admission: RE | Admit: 2018-10-27 | Discharge: 2018-10-27 | Disposition: A | Discharge status: Home / Self Care | Payer: 59 | Source: Ambulatory Visit | Attending: Obstetrics and Gynecology | Admitting: Obstetrics and Gynecology

## 2018-10-27 DIAGNOSIS — R928 Other abnormal and inconclusive findings on diagnostic imaging of breast: Secondary | ICD-10-CM

## 2019-03-27 ENCOUNTER — Telehealth: Payer: 59 | Admitting: Physician Assistant

## 2019-03-27 DIAGNOSIS — B9789 Other viral agents as the cause of diseases classified elsewhere: Secondary | ICD-10-CM

## 2019-03-27 DIAGNOSIS — J019 Acute sinusitis, unspecified: Secondary | ICD-10-CM

## 2019-03-27 MED ORDER — BENZONATATE 100 MG PO CAPS: 100.0000 mg | ORAL_CAPSULE | Freq: Three times a day (TID) | ORAL | 0 refills | Status: DC | PRN

## 2019-03-27 MED ORDER — FLUTICASONE PROPIONATE 50 MCG/ACT NA SUSP
2.0000 | Freq: Every day | NASAL | 6 refills | Status: DC
Start: 2019-03-27 — End: 2020-07-03

## 2019-03-27 NOTE — Progress Notes (Signed)
We are sorry that you are not feeling well.  Here is how we plan to help!  Based on what you have shared with me it looks like you have sinusitis.  Sinusitis is inflammation and infection in the sinus cavities of the head.  Based on your presentation I believe you most likely have Acute Viral Sinusitis.This is an infection most likely caused by a virus.   There is not specific treatment for viral sinusitis other than to help you with the symptoms until the infection runs its course.  You may use an oral decongestant such as Mucinex D or if you have glaucoma or high blood pressure use plain Mucinex. Saline nasal spray help and can safely be used as often as needed for congestion, I have prescribed: Fluticasone nasal spray two sprays in each nostril once a day I have also prescribed a cough medication Tessalon, take 1-2 capsules 2 times daily as needed.    Some authorities believe that zinc sprays or the use of Echinacea may shorten the course of your symptoms.  Sinus infections are not as easily transmitted as other respiratory infection, however we still recommend that you avoid close contact with loved ones, especially the very young and elderly.  Remember to wash your hands thoroughly throughout the day as this is the number one way to prevent the spread of infection!  Home Care:  Only take medications as instructed by your medical team.  Do not take these medications with alcohol.  A steam or ultrasonic humidifier can help congestion.  You can place a towel over your head and breathe in the steam from hot water coming from a faucet.  Avoid close contacts especially the very young and the elderly.  Cover your mouth when you cough or sneeze.  Always remember to wash your hands.  Get Help Right Away If:  You develop worsening fever or sinus pain.  You develop a severe head ache or visual changes.  Your symptoms persist after you have completed your treatment plan.  Make sure  you  Understand these instructions.  Will watch your condition.  Will get help right away if you are not doing well or get worse.  Your e-visit answers were reviewed by a board certified advanced clinical practitioner to complete your personal care plan.  Depending on the condition, your plan could have included both over the counter or prescription medications.  If there is a problem please reply  once you have received a response from your provider.  Your safety is important to [place].  If you have drug allergies check your prescription carefully.    You can use MyChart to ask questions about today's visit, request a non-urgent call back, or ask for a work or school excuse for 24 hours related to this e-Visit. If it has been greater than 24 hours you will need to follow up with your provider, or enter a new e-Visit to address those concerns.  You will get an e-mail in the next two days asking about your experience.  I hope that your e-visit has been valuable and will speed your recovery. Thank you for using e-visits.   Greater than 5 minutes, yet less than 10 minutes of time have been spent researching, coordinating and implementing care for this patient today.

## 2019-11-22 ENCOUNTER — Telehealth: Payer: Self-pay | Admitting: Internal Medicine

## 2019-11-22 NOTE — Telephone Encounter (Signed)
Pt would like a new patient appt with Dr Franky Macho. She would like [facility] available, any time is ok. Pt states please go ahead and schedule the appt.  If she is not able to answer her phone, please leave appt details on her VM. Thank you!!

## 2019-11-23 NOTE — Telephone Encounter (Signed)
Pt. Has been scheduled and called

## 2019-11-23 NOTE — Telephone Encounter (Signed)
Pls see note from pt

## 2020-01-31 ENCOUNTER — Encounter: Payer: Self-pay | Admitting: Emergency Medicine

## 2020-01-31 ENCOUNTER — Ambulatory Visit (INDEPENDENT_AMBULATORY_CARE_PROVIDER_SITE_OTHER): Payer: 59 | Admitting: Emergency Medicine

## 2020-01-31 ENCOUNTER — Other Ambulatory Visit: Payer: Self-pay

## 2020-01-31 VITALS — BP 142/93 | HR 101 | Temp 98.5°F | Resp 16 | Ht 71.0 in | Wt 220.0 lb

## 2020-01-31 DIAGNOSIS — Z7689 Persons encountering health services in other specified circumstances: Secondary | ICD-10-CM

## 2020-01-31 DIAGNOSIS — Z6830 Body mass index (BMI) 30.0-30.9, adult: Secondary | ICD-10-CM | POA: Diagnosis not present

## 2020-01-31 DIAGNOSIS — F419 Anxiety disorder, unspecified: Secondary | ICD-10-CM | POA: Diagnosis not present

## 2020-01-31 DIAGNOSIS — F331 Major depressive disorder, recurrent, moderate: Secondary | ICD-10-CM | POA: Diagnosis not present

## 2020-01-31 DIAGNOSIS — R030 Elevated blood-pressure reading, without diagnosis of hypertension: Secondary | ICD-10-CM

## 2020-01-31 MED ORDER — BUPROPION HCL ER (XL) 150 MG PO TB24: 150.0000 mg | ORAL_TABLET | Freq: Every day | ORAL | 1 refills | Status: DC

## 2020-01-31 NOTE — Patient Instructions (Addendum)
   If you have lab work done today you will be contacted with your lab results within the next 2 weeks.  If you have not heard from us then please contact us. The fastest way to get your results is to register for My Chart.   IF you received an x-ray today, you will receive an invoice from [facility]. Please contact [facility] at [phone] with questions or concerns regarding your invoice.   IF you received labwork today, you will receive an invoice from [facility]. Please contact [facility] at [phone] with questions or concerns regarding your invoice.   Our billing staff will not be able to assist you with questions regarding bills from these companies.  You will be contacted with the lab results as soon as they are available. The fastest way to get your results is to activate your My Chart account. Instructions are located on the last page of this paperwork. If you have not heard from us regarding the results in 2 weeks, please contact this office.      Living With Depression Everyone experiences occasional disappointment, sadness, and loss in their lives. When you are feeling down, blue, or sad for at least 2 weeks in a row, it may mean that you have depression. Depression can affect your thoughts and feelings, relationships, daily activities, and physical health. It is caused by changes in the way your brain functions. If you receive a diagnosis of depression, your health care provider will tell you which type of depression you have and what treatment options are available to you. If you are living with depression, there are ways to help you recover from it and also ways to prevent it from coming back. How to cope with lifestyle changes Coping with stress     Stress is your body's reaction to life changes and events, both good and bad. Stressful situations may include:  Getting married.  The death of a spouse.  Losing a job.  Retiring.  Having a  baby. Stress can last just a few hours or it can be ongoing. Stress can play a major role in depression, so it is important to learn both how to cope with stress and how to think about it differently. Talk with your health care provider or a counselor if you would like to learn more about stress reduction. He or she may suggest some stress reduction techniques, such as:  Music therapy. This can include creating music or listening to music. Choose music that you enjoy and that inspires you.  Mindfulness-based meditation. This kind of meditation can be done while sitting or walking. It involves being aware of your normal breaths, rather than trying to control your breathing.  Centering prayer. This is a kind of meditation that involves focusing on a spiritual word or phrase. Choose a word, phrase, or sacred image that is meaningful to you and that brings you peace.  Deep breathing. To do this, expand your stomach and inhale slowly through your nose. Hold your breath for 3-5 seconds, then exhale slowly, allowing your stomach muscles to relax.  Muscle relaxation. This involves intentionally tensing muscles then relaxing them. Choose a stress reduction technique that fits your lifestyle and personality. Stress reduction techniques take time and practice to develop. Set aside 5-15 minutes a day to do them. Therapists can offer training in these techniques. The training may be covered by some insurance plans. Other things you can do to manage stress include:  Keeping a stress diary.   This can help you learn what triggers your stress and ways to control your response.  Understanding what your limits are and saying no to requests or events that lead to a schedule that is too full.  Thinking about how you respond to certain situations. You may not be able to control everything, but you can control how you react.  Adding humor to your life by watching funny films or TV shows.  Making time for activities  that help you relax and not feeling guilty about spending your time this way.  Medicines Your health care provider may suggest certain medicines if he or she feels that they will help improve your condition. Avoid using alcohol and other substances that may prevent your medicines from working properly (may interact). It is also important to:  Talk with your pharmacist or health care provider about all the medicines that you take, their possible side effects, and what medicines are safe to take together.  Make it your goal to take part in all treatment decisions (shared decision-making). This includes giving input on the side effects of medicines. It is best if shared decision-making with your health care provider is part of your total treatment plan. If your health care provider prescribes a medicine, you may not notice the full benefits of it for 4-8 weeks. Most people who are treated for depression need to be on medicine for at least 6-12 months after they feel better. If you are taking medicines as part of your treatment, do not stop taking medicines without first talking to your health care provider. You may need to have the medicine slowly decreased (tapered) over time to decrease the risk of harmful side effects. Relationships Your health care provider may suggest family therapy along with individual therapy and drug therapy. While there may not be family problems that are causing you to feel depressed, it is still important to make sure your family learns as much as they can about your mental health. Having your family's support can help make your treatment successful. How to recognize changes in your condition Everyone has a different response to treatment for depression. Recovery from major depression happens when you have not had signs of major depression for two months. This may mean that you will start to:  Have more interest in doing activities.  Feel less hopeless than you did 2 months  ago.  Have more energy.  Overeat less often, or have better or improving appetite.  Have better concentration. Your health care provider will work with you to decide the next steps in your recovery. It is also important to recognize when your condition is getting worse. Watch for these signs:  Having fatigue or low energy.  Eating too much or too little.  Sleeping too much or too little.  Feeling restless, agitated, or hopeless.  Having trouble concentrating or making decisions.  Having unexplained physical complaints.  Feeling irritable, angry, or aggressive. Get help as soon as you or your family members notice these symptoms coming back. How to get support and help from others How to talk with friends and family members about your condition  Talking to friends and family members about your condition can provide you with one way to get support and guidance. Reach out to trusted friends or family members, explain your symptoms to them, and let them know that you are working with a health care provider to treat your depression. Financial resources Not all insurance plans cover mental health care, so it   is important to check with your insurance carrier. If paying for co-pays or counseling services is a problem, search for a local or county mental health care center. They may be able to offer public mental health care services at low or no cost when you are not able to see a private health care provider. If you are taking medicine for depression, you may be able to get the generic form, which may be less expensive. Some makers of prescription medicines also offer help to patients who cannot afford the medicines they need. Follow these instructions at home:   Get the right amount and quality of sleep.  Cut down on using caffeine, tobacco, alcohol, and other potentially harmful substances.  Try to exercise, such as walking or lifting small weights.  Take over-the-counter and  prescription medicines only as told by your health care provider.  Eat a healthy diet that includes plenty of vegetables, fruits, whole grains, low-fat dairy products, and lean protein. Do not eat a lot of foods that are high in solid fats, added sugars, or salt.  Keep all follow-up visits as told by your health care provider. This is important. Contact a health care provider if:  You stop taking your antidepressant medicines, and you have any of these symptoms: ? Nausea. ? Headache. ? Feeling lightheaded. ? Chills and body aches. ? Not being able to sleep (insomnia).  You or your friends and family think your depression is getting worse. Get help right away if:  You have thoughts of hurting yourself or others. If you ever feel like you may hurt yourself or others, or have thoughts about taking your own life, get help right away. You can go to your nearest emergency department or call:  Your local emergency services (911 in the [place]  A suicide crisis helpline, such as the National Suicide Prevention Lifeline at 1-800-273-8255. This is open 24-hours a day. Summary  If you are living with depression, there are ways to help you recover from it and also ways to prevent it from coming back.  Work with your health care team to create a management plan that includes counseling, stress management techniques, and healthy lifestyle habits. This information is not intended to replace advice given to you by your health care provider. Make sure you discuss any questions you have with your health care provider. Document Revised: 07/22/2018 Document Reviewed: 03/02/2016 [employer] Patient Education  2020 [employer]  

## 2020-01-31 NOTE — Progress Notes (Signed)
Stacy Bray 39 y.o.   Chief Complaint  Patient presents with  . Establish Care    HISTORY OF PRESENT ILLNESS: This is a 39 y.o. female here to establish care with me. Complaining of chronic depression and anxiety but worse in the past several months. Depression screen PHQ 2/9 01/31/2020  Decreased Interest 1  Down, Depressed, Hopeless 3  PHQ - 2 Score 4  Altered sleeping 3  Tired, decreased energy 3  Change in appetite 3  Feeling bad or failure about yourself  1  Trouble concentrating 3  Moving slowly or fidgety/restless 0  Suicidal thoughts 1  PHQ-9 Score 18  Difficult doing work/chores Very difficult  Has been on Wellbutrin before.  Wants to go back on it. No recent psychiatry evaluation. No recent general medical evaluation. No history of chronic medical problems.   HPI   Prior to Admission medications   Medication Sig Start Date End Date Taking? Authorizing Provider  benzonatate (TESSALON) 100 MG capsule Take 1-2 capsules (100-200 mg total) by mouth 3 (three) times daily as needed for cough. 03/27/19   McVey, Gelene Mink, PA-C  fluticasone (FLONASE) 50 MCG/ACT nasal spray Place 2 sprays into both nostrils daily. 03/27/19   McVey, Gelene Mink, PA-C  HYDROcodone-acetaminophen (NORCO) 5-325 MG tablet Take 1-2 tablets by mouth every 6 (six) hours as needed for severe pain (May cause constipation). For severe pain only. Do not mix with alcohol, benzodiazepines, muscle relaxer. No refills without office visit. 11/07/17   Tereasa Coop, PA-C  naproxen sodium (ALEVE) 220 MG tablet Take 2 tablets (440 mg total) by mouth every morning. 11/07/17   Tereasa Coop, PA-C    No Known Allergies  Patient Active Problem List   Diagnosis Date Noted  . Left cervical lymphadenopathy 10/07/2015  . Skin lesion of scalp 08/12/2011  . Sebaceous cyst 07/30/2011    Past Medical History:  Diagnosis Date  . Anxiety    Phreesia 01/28/2020  . Depression    Phreesia  01/28/2020    Past Surgical History:  Procedure Laterality Date  . INTRAUTERINE DEVICE INSERTION  2009  . LITHOTRIPSY  2000   kidney stones    Social History   Socioeconomic History  . Marital status: Married    Spouse name: Not on file  . Number of children: Not on file  . Years of education: Not on file  . Highest education level: Not on file  Occupational History  . Not on file  Tobacco Use  . Smoking status: Former Smoker    Packs/day: 1.00    Types: Cigarettes    Quit date: 05/01/2011    Years since quitting: 8.7  Substance and Sexual Activity  . Alcohol use: Yes  . Drug use: No  . Sexual activity: Yes    Birth control/protection: I.U.D.  Other Topics Concern  . Not on file  Social History Narrative  . Not on file   Social Determinants of Health   Financial Resource Strain:   . Difficulty of Paying Living Expenses: Not on file  Food Insecurity:   . Worried About Charity fundraiser in the Last Year: Not on file  . Ran Out of Food in the Last Year: Not on file  Transportation Needs:   . Lack of Transportation (Medical): Not on file  . Lack of Transportation (Non-Medical): Not on file  Physical Activity:   . Days of Exercise per Week: Not on file  . Minutes of Exercise per Session: Not on  file  Stress:   . Feeling of Stress : Not on file  Social Connections:   . Frequency of Communication with Friends and Family: Not on file  . Frequency of Social Gatherings with Friends and Family: Not on file  . Attends Religious Services: Not on file  . Active Member of Clubs or Organizations: Not on file  . Attends Archivist Meetings: Not on file  . Marital Status: Not on file  Intimate Partner Violence:   . Fear of Current or Ex-Partner: Not on file  . Emotionally Abused: Not on file  . Physically Abused: Not on file  . Sexually Abused: Not on file    Family History  Problem Relation Age of Onset  . Cancer Mother        colon cancer  . Cancer  Maternal Aunt        melanoma, bladder cancer  . Cancer Maternal Grandmother        Breast Cancer  . Cancer Maternal Grandfather        colon cancer  . Breast cancer Paternal Aunt   . Breast cancer Paternal Grandmother      Review of Systems  Constitutional: Negative.  Negative for chills and fever.  HENT: Negative.   Eyes: Negative.   Respiratory: Negative.  Negative for cough and shortness of breath.   Cardiovascular: Negative.  Negative for chest pain and palpitations.  Gastrointestinal: Negative.  Negative for abdominal pain, diarrhea, heartburn, nausea and vomiting.  Genitourinary: Negative.  Negative for dysuria and hematuria.  Musculoskeletal: Negative.  Negative for back pain, myalgias and neck pain.  Skin: Negative.  Negative for rash.  Neurological: Negative.  Negative for dizziness and headaches.  Endo/Heme/Allergies: Negative.   Psychiatric/Behavioral: Positive for depression. The patient is nervous/anxious.   All other systems reviewed and are negative.   Today's Vitals   01/31/20 1320  BP: (!) 142/93  Pulse: (!) 101  Resp: 16  Temp: 98.5 F (36.9 C)  TempSrc: Temporal  SpO2: 98%  Weight: 220 lb (99.8 kg)  Height: 5\' 11"  (1.803 m)   Body mass index is 30.68 kg/m. Wt Readings from Last 3 Encounters:  01/31/20 220 lb (99.8 kg)  08/12/11 198 lb 12.8 oz (90.2 kg)  07/30/11 202 lb (91.6 kg)    Physical Exam Vitals reviewed.  Constitutional:      Appearance: Normal appearance.  HENT:     Head: Normocephalic.  Eyes:     Extraocular Movements: Extraocular movements intact.     Pupils: Pupils are equal, round, and reactive to light.  Cardiovascular:     Rate and Rhythm: Normal rate and regular rhythm.     Pulses: Normal pulses.     Heart sounds: Normal heart sounds.  Pulmonary:     Effort: Pulmonary effort is normal.     Breath sounds: Normal breath sounds.  Musculoskeletal:        General: Normal range of motion.     Cervical back: Normal range of  motion.  Skin:    General: Skin is warm and dry.     Capillary Refill: Capillary refill takes less than 2 seconds.  Neurological:     General: No focal deficit present.     Mental Status: She is alert and oriented to person, place, and time.  Psychiatric:        Mood and Affect: Mood normal.        Behavior: Behavior normal.      ASSESSMENT & PLAN: Tara was  seen today for establish care and anxiety.  Diagnoses and all orders for this visit:  Moderate episode of recurrent major depressive disorder ([place]) -     Comprehensive metabolic panel -     Hemoglobin A1c -     Lipid panel -     Cancel: Ambulatory referral to Psychiatry -     buPROPion (WELLBUTRIN XL) 150 MG 24 hr tablet; Take 1 tablet (150 mg total) by mouth daily. -     CBC with Differential/Platelet -     Ambulatory referral to Psychiatry  Body mass index (BMI) of 30.0-30.9 in adult  Chronic anxiety -     Ambulatory referral to Psychiatry  Encounter to establish care  Elevated blood pressure reading without diagnosis of hypertension    Patient Instructions       If you have lab work done today you will be contacted with your lab results within the next 2 weeks.  If you have not heard from [place] then please contact us. The fastest way to get your results is to register for My Chart.   IF you received an x-ray today, you will receive an invoice from [facility]. Please contact [facility] at [phone] with questions or concerns regarding your invoice.   IF you received labwork today, you will receive an invoice from [facility]. Please contact [facility] at [phone] with questions or concerns regarding your invoice.   Our billing staff will not be able to assist you with questions regarding bills from these companies.  You will be contacted with the lab results as soon as they are available. The fastest way to get your results is to activate your My Chart account. Instructions are located  on the last page of this paperwork. If you have not heard from [place] regarding the results in 2 weeks, please contact this office.     Living With Depression Everyone experiences occasional disappointment, sadness, and loss in their lives. When you are feeling down, blue, or sad for at least 2 weeks in a row, it may mean that you have depression. Depression can affect your thoughts and feelings, relationships, daily activities, and physical health. It is caused by changes in the way your brain functions. If you receive a diagnosis of depression, your health care provider will tell you which type of depression you have and what treatment options are available to you. If you are living with depression, there are ways to help you recover from it and also ways to prevent it from coming back. How to cope with lifestyle changes Coping with stress     Stress is your body's reaction to life changes and events, both good and bad. Stressful situations may include:  Getting married.  The death of a spouse.  Losing a job.  Retiring.  Having a baby. Stress can last just a few hours or it can be ongoing. Stress can play a major role in depression, so it is important to learn both how to cope with stress and how to think about it differently. Talk with your health care provider or a counselor if you would like to learn more about stress reduction. He or she may suggest some stress reduction techniques, such as:  Music therapy. This can include creating music or listening to music. Choose music that you enjoy and that inspires you.  Mindfulness-based meditation. This kind of meditation can be done while sitting or walking. It involves being aware of your normal breaths, rather than trying to control  your breathing.  Centering prayer. This is a kind of meditation that involves focusing on a spiritual word or phrase. Choose a word, phrase, or sacred image that is meaningful to you and that brings you  peace.  Deep breathing. To do this, expand your stomach and inhale slowly through your nose. Hold your breath for 3-5 seconds, then exhale slowly, allowing your stomach muscles to relax.  Muscle relaxation. This involves intentionally tensing muscles then relaxing them. Choose a stress reduction technique that fits your lifestyle and personality. Stress reduction techniques take time and practice to develop. Set aside 5-15 minutes a day to do them. Therapists can offer training in these techniques. The training may be covered by some insurance plans. Other things you can do to manage stress include:  Keeping a stress diary. This can help you learn what triggers your stress and ways to control your response.  Understanding what your limits are and saying no to requests or events that lead to a schedule that is too full.  Thinking about how you respond to certain situations. You may not be able to control everything, but you can control how you react.  Adding humor to your life by watching funny films or TV shows.  Making time for activities that help you relax and not feeling guilty about spending your time this way.  Medicines Your health care provider may suggest certain medicines if he or she feels that they will help improve your condition. Avoid using alcohol and other substances that may prevent your medicines from working properly (may interact). It is also important to:  Talk with your pharmacist or health care provider about all the medicines that you take, their possible side effects, and what medicines are safe to take together.  Make it your goal to take part in all treatment decisions (shared decision-making). This includes giving input on the side effects of medicines. It is best if shared decision-making with your health care provider is part of your total treatment plan. If your health care provider prescribes a medicine, you may not notice the full benefits of it for 4-8 weeks.  Most people who are treated for depression need to be on medicine for at least 6-12 months after they feel better. If you are taking medicines as part of your treatment, do not stop taking medicines without first talking to your health care provider. You may need to have the medicine slowly decreased (tapered) over time to decrease the risk of harmful side effects. Relationships Your health care provider may suggest family therapy along with individual therapy and drug therapy. While there may not be family problems that are causing you to feel depressed, it is still important to make sure your family learns as much as they can about your mental health. Having your family's support can help make your treatment successful. How to recognize changes in your condition Everyone has a different response to treatment for depression. Recovery from major depression happens when you have not had signs of major depression for two months. This may mean that you will start to:  Have more interest in doing activities.  Feel less hopeless than you did 2 months ago.  Have more energy.  Overeat less often, or have better or improving appetite.  Have better concentration. Your health care provider will work with you to decide the next steps in your recovery. It is also important to recognize when your condition is getting worse. Watch for these signs:  Having fatigue or  low energy.  Eating too much or too little.  Sleeping too much or too little.  Feeling restless, agitated, or hopeless.  Having trouble concentrating or making decisions.  Having unexplained physical complaints.  Feeling irritable, angry, or aggressive. Get help as soon as you or your family members notice these symptoms coming back. How to get support and help from others How to talk with friends and family members about your condition  Talking to friends and family members about your condition can provide you with one way to get  support and guidance. Reach out to trusted friends or family members, explain your symptoms to them, and let them know that you are working with a health care provider to treat your depression. Financial resources Not all insurance plans cover mental health care, so it is important to check with your insurance carrier. If paying for co-pays or counseling services is a problem, search for a local or county mental health care center. They may be able to offer public mental health care services at low or no cost when you are not able to see a private health care provider. If you are taking medicine for depression, you may be able to get the generic form, which may be less expensive. Some makers of prescription medicines also offer help to patients who cannot afford the medicines they need. Follow these instructions at home:   Get the right amount and quality of sleep.  Cut down on using caffeine, tobacco, alcohol, and other potentially harmful substances.  Try to exercise, such as walking or lifting small weights.  Take over-the-counter and prescription medicines only as told by your health care provider.  Eat a healthy diet that includes plenty of vegetables, fruits, whole grains, low-fat dairy products, and lean protein. Do not eat a lot of foods that are high in solid fats, added sugars, or salt.  Keep all follow-up visits as told by your health care provider. This is important. Contact a health care provider if:  You stop taking your antidepressant medicines, and you have any of these symptoms: ? Nausea. ? Headache. ? Feeling lightheaded. ? Chills and body aches. ? Not being able to sleep (insomnia).  You or your friends and family think your depression is getting worse. Get help right away if:  You have thoughts of hurting yourself or others. If you ever feel like you may hurt yourself or others, or have thoughts about taking your own life, get help right away. You can go to your  nearest emergency department or call:  Your local emergency services (911 in the [place]  A suicide crisis helpline, such as the [place] at (757)714-8077. This is open 24-hours a day. Summary  If you are living with depression, there are ways to help you recover from it and also ways to prevent it from coming back.  Work with your health care team to create a management plan that includes counseling, stress management techniques, and healthy lifestyle habits. This information is not intended to replace advice given to you by your health care provider. Make sure you discuss any questions you have with your health care provider. Document Revised: 07/22/2018 Document Reviewed: 03/02/2016 Elsevier Patient Education  2020 Elsevier Inc.      Agustina Caroli, MD [facility]

## 2020-02-01 LAB — COMPREHENSIVE METABOLIC PANEL
ALT: 32 IU/L (ref 0–32)
AST: 30 IU/L (ref 0–40)
Albumin/Globulin Ratio: 2.1 (ref 1.2–2.2)
Albumin: 4.5 g/dL (ref 3.8–4.8)
Alkaline Phosphatase: 86 IU/L (ref 44–121)
BUN/Creatinine Ratio: 7 — ABNORMAL LOW (ref 9–23)
BUN: 6 mg/dL (ref 6–24)
Bilirubin Total: 0.4 mg/dL (ref 0.0–1.2)
CO2: 26 mmol/L (ref 20–29)
Calcium: 9.5 mg/dL (ref 8.7–10.2)
Chloride: 100 mmol/L (ref 96–106)
Creatinine, Ser: 0.86 mg/dL (ref 0.57–1.00)
GFR calc Af Amer: 97 mL/min/{1.73_m2} (ref >59)
GFR calc non Af Amer: 84 mL/min/{1.73_m2} (ref >59)
Globulin, Total: 2.1 g/dL (ref 1.5–4.5)
Glucose: 98 mg/dL (ref 65–99)
Potassium: 3.2 mmol/L — ABNORMAL LOW (ref 3.5–5.2)
Sodium: 140 mmol/L (ref 134–144)
Total Protein: 6.6 g/dL (ref 6.0–8.5)

## 2020-02-01 LAB — CBC WITH DIFFERENTIAL/PLATELET
Basophils Absolute: 0.1 10*3/uL (ref 0.0–0.2)
Basos: 1 %
EOS (ABSOLUTE): 0.1 10*3/uL (ref 0.0–0.4)
Eos: 1 %
Hematocrit: 42.1 % (ref 34.0–46.6)
Hemoglobin: 14.7 g/dL (ref 11.1–15.9)
Immature Grans (Abs): 0 10*3/uL (ref 0.0–0.1)
Immature Granulocytes: 0 %
Lymphocytes Absolute: 2.1 10*3/uL (ref 0.7–3.1)
Lymphs: 27 %
MCH: 34.9 pg — ABNORMAL HIGH (ref 26.6–33.0)
MCHC: 34.9 g/dL (ref 31.5–35.7)
MCV: 100 fL — ABNORMAL HIGH (ref 79–97)
Monocytes Absolute: 0.6 10*3/uL (ref 0.1–0.9)
Monocytes: 8 %
Neutrophils Absolute: 4.8 10*3/uL (ref 1.4–7.0)
Neutrophils: 63 %
Platelets: 194 10*3/uL (ref 150–450)
RBC: 4.21 x10E6/uL (ref 3.77–5.28)
RDW: 11.8 % (ref 11.7–15.4)
WBC: 7.6 10*3/uL (ref 3.4–10.8)

## 2020-02-01 LAB — HEMOGLOBIN A1C
Est. average glucose Bld gHb Est-mCnc: 103 mg/dL
Hgb A1c MFr Bld: 5.2 % (ref 4.8–5.6)

## 2020-02-01 LAB — LIPID PANEL
Chol/HDL Ratio: 3 ratio (ref 0.0–4.4)
Cholesterol, Total: 192 mg/dL (ref 100–199)
HDL: 64 mg/dL (ref >39)
LDL Chol Calc (NIH): 101 mg/dL — ABNORMAL HIGH (ref 0–99)
Triglycerides: 155 mg/dL — ABNORMAL HIGH (ref 0–149)
VLDL Cholesterol Cal: 27 mg/dL (ref 5–40)

## 2020-02-05 ENCOUNTER — Emergency Department (HOSPITAL_COMMUNITY)
Admission: EM | Admit: 2020-02-05 | Discharge: 2020-02-05 | Disposition: A | Discharge status: Home / Self Care | Payer: 59 | Attending: Emergency Medicine | Admitting: Emergency Medicine

## 2020-02-05 ENCOUNTER — Other Ambulatory Visit: Payer: Self-pay

## 2020-02-05 DIAGNOSIS — Z79899 Other long term (current) drug therapy: Secondary | ICD-10-CM | POA: Diagnosis not present

## 2020-02-05 DIAGNOSIS — F410 Panic disorder [episodic paroxysmal anxiety] without agoraphobia: Secondary | ICD-10-CM | POA: Insufficient documentation

## 2020-02-05 DIAGNOSIS — F329 Major depressive disorder, single episode, unspecified: Secondary | ICD-10-CM | POA: Insufficient documentation

## 2020-02-05 DIAGNOSIS — Z87891 Personal history of nicotine dependence: Secondary | ICD-10-CM | POA: Diagnosis not present

## 2020-02-05 DIAGNOSIS — F419 Anxiety disorder, unspecified: Secondary | ICD-10-CM | POA: Diagnosis not present

## 2020-02-05 LAB — CBG MONITORING, ED: Glucose-Capillary: 119 mg/dL — ABNORMAL HIGH (ref 70–99)

## 2020-02-05 MED ORDER — LORAZEPAM 0.5 MG PO TABS: 0.5000 mg | ORAL_TABLET | Freq: Three times a day (TID) | ORAL | 0 refills | Status: DC | PRN

## 2020-02-05 MED ORDER — LORAZEPAM 0.5 MG PO TABS
0.5000 mg | ORAL_TABLET | Freq: Once | ORAL | Status: AC
  Administered 2020-02-05: 0.5 mg via ORAL
  Filled 2020-02-05: qty 1

## 2020-02-05 NOTE — ED Triage Notes (Signed)
Pt here from home with c/o panic attack very anxious in triage hyperventilating has recently started on Wellbutrin

## 2020-02-05 NOTE — ED Provider Notes (Addendum)
[facility] EMERGENCY DEPARTMENT Provider Note   CSN: [id number] Arrival date & time: 02/05/20  1338     History Chief Complaint  Patient presents with  . Panic Attack    Stacy Bray is a 39 y.o. female.  The history is provided by the patient. No language interpreter was used.  Anxiety This is a new problem. The current episode started more than 1 week ago. The problem occurs constantly. The problem has been gradually worsening. Nothing aggravates the symptoms. Nothing relieves the symptoms. She has tried nothing for the symptoms. The treatment provided no relief.   Pt reports her MD started her on Wellbutrin 5 days ago for anxiety and depression.  Pt reports her job stresses her out. Pt reports she had an anxiety attack today.      Past Medical History:  Diagnosis Date  . Anxiety    Phreesia 01/28/2020  . Depression    Phreesia 01/28/2020    Patient Active Problem List   Diagnosis Date Noted  . Left cervical lymphadenopathy 10/07/2015  . Skin lesion of scalp 08/12/2011  . Sebaceous cyst 07/30/2011    Past Surgical History:  Procedure Laterality Date  . INTRAUTERINE DEVICE INSERTION  2009  . LITHOTRIPSY  2000   kidney stones     OB History   No obstetric history on file.     Family History  Problem Relation Age of Onset  . Cancer Mother        colon cancer, ovarian cancer  . Cancer Maternal Aunt        melanoma, bladder cancer  . Cancer Maternal Grandmother        Breast Cancer  . Colon cancer Maternal Grandmother   . Cancer Maternal Grandfather        colon cancer  . Breast cancer Paternal Aunt   . Breast cancer Paternal Grandmother     Social History   Tobacco Use  . Smoking status: Former Smoker    Packs/day: 1.00    Years: 20.00    Pack years: 20.00    Types: Cigarettes    Quit date: 05/01/2011    Years since quitting: 8.7  . Smokeless tobacco: Never Used  Substance Use Topics  . Alcohol use: Yes     Alcohol/week: 7.0 standard drinks    Types: 7 Glasses of wine per week  . Drug use: No    Home Medications Prior to Admission medications   Medication Sig Start Date End Date Taking? Authorizing Provider  buPROPion (WELLBUTRIN XL) 150 MG 24 hr tablet Take 1 tablet (150 mg total) by mouth daily. 01/31/20 04/30/20  Horald Pollen, MD  fluticasone [facility]) 50 MCG/ACT nasal spray Place 2 sprays into both nostrils daily. Patient not taking: Reported on 01/31/2020 03/27/19   McVey, Gelene Mink, PA-C  LORazepam (ATIVAN) 0.5 MG tablet Take 1 tablet (0.5 mg total) by mouth every 8 (eight) hours as needed for anxiety. 02/05/20 02/04/21  Fransico Meadow, PA-C    Allergies    Penicillins  Review of Systems   Review of Systems  All other systems reviewed and are negative.   Physical Exam Updated Vital Signs BP (!) 130/102   Pulse 76   Temp 98.6 F (37 C) (Oral)   Resp 18   SpO2 100%   Physical Exam Vitals and nursing note reviewed.  Constitutional:      Appearance: She is well-developed.  HENT:     Head: Normocephalic.  Cardiovascular:  Rate and Rhythm: Normal rate.  Pulmonary:     Effort: Pulmonary effort is normal.  Abdominal:     General: There is no distension.  Musculoskeletal:        General: Normal range of motion.     Cervical back: Normal range of motion.  Neurological:     General: No focal deficit present.     Mental Status: She is alert and oriented to person, place, and time.  Psychiatric:        Mood and Affect: Mood normal.     ED Results / Procedures / Treatments   Labs (all labs ordered are listed, but only abnormal results are displayed) Labs Reviewed  CBG MONITORING, ED - Abnormal; Notable for the following components:      Result Value   Glucose-Capillary 119 (*)    All other components within normal limits    EKG EKG Interpretation  Date/Time:  Monday February 05 2020 13:47:30 EDT Ventricular Rate:  88 PR Interval:  140 QRS  Duration: 88 QT Interval:  372 QTC Calculation: 450 R Axis:   15 Text Interpretation: Normal sinus rhythm Low voltage QRS Borderline ECG No old tracing to compare Confirmed by Sherwood Gambler [phone]) on 02/05/2020 3:49:11 PM   Radiology No results found.  Procedures Procedures (including critical care time)  Medications Ordered in ED Medications  LORazepam (ATIVAN) tablet 0.5 mg (0.5 mg Oral Given 02/05/20 1421)    ED Course  I have reviewed the triage vital signs and the nursing notes.  Pertinent labs & imaging results that were available during my care of the patient were reviewed by me and considered in my medical decision making (see chart for details).    MDM Rules/Calculators/A&P                          MDM:  Pt given ativan 0.5 mg.  Pt feels some better.  Pt advised to follow up with her MD and behavioral health.  Pt given rx for 10 ativan tablets.   Final Clinical Impression(s) / ED Diagnoses Final diagnoses:  Panic attack    Rx / DC Orders ED Discharge Orders         Ordered    LORazepam (ATIVAN) 0.5 MG tablet  Every 8 hours PRN        02/05/20 1705        An After Visit Summary was printed and given to the patient.    Fransico Meadow, PA-C 02/05/20 2050    Fransico Meadow, PA-C 02/05/20 2142    Gareth Morgan, MD 02/08/20 [phone]

## 2020-04-09 ENCOUNTER — Other Ambulatory Visit: Payer: Self-pay

## 2020-04-09 ENCOUNTER — Telehealth (INDEPENDENT_AMBULATORY_CARE_PROVIDER_SITE_OTHER): Payer: 59 | Admitting: Registered Nurse

## 2020-04-09 DIAGNOSIS — R6889 Other general symptoms and signs: Secondary | ICD-10-CM

## 2020-04-09 LAB — POCT INFLUENZA A/B
Influenza A, POC: NEGATIVE
Influenza B, POC: NEGATIVE

## 2020-04-09 MED ORDER — OSELTAMIVIR PHOSPHATE 75 MG PO CAPS: 75.0000 mg | ORAL_CAPSULE | Freq: Two times a day (BID) | ORAL | 0 refills | Status: DC

## 2020-04-09 NOTE — Progress Notes (Signed)
Telemedicine Encounter- SOAP NOTE Established Patient  This telephone encounter was conducted with the patient's (or proxy's) verbal consent via audio telecommunications: yes  Patient was instructed to have this encounter in a suitably private space; and to only have persons present to whom they give permission to participate. In addition, patient identity was confirmed by use of name plus two identifiers (DOB and address).  I discussed the limitations, risks, security and privacy concerns of performing an evaluation and management service by telephone and the availability of in person appointments. I also discussed with the patient that there may be a patient responsible charge related to this service. The patient expressed understanding and agreed to proceed.  I spent a total of 15 minutes talking with the patient or their proxy.  Patient at home Provider in office  Chief Complaint  Patient presents with  . Cough    Patient states starting yesterday she has been having severe headaches , vomiting, fever of 102.0 , diarrhea and some chills. Per patient she took some Nyquil and has head on a ice pack.    Subjective   Stacy Bray is a 39 y.o. established patient. Telephone visit today for cough  HPI  Started 1-2 days ago. covid vaccine - second dose October 12, 2019. No booster yet Has had severe headaches, vomiting, fever, chills, and diarrhea No sick contacts that she is aware of Has been taking OTCs inc nyquil which have provided limited relief No profound shob, doe, chest pain Headache is worst of symptoms. It is very limiting Would like to get testing and treatment  No other concerns   Patient Active Problem List   Diagnosis Date Noted  . Left cervical lymphadenopathy 10/07/2015  . Skin lesion of scalp 08/12/2011  . Sebaceous cyst 07/30/2011    Past Medical History:  Diagnosis Date  . Anxiety    Phreesia 01/28/2020  . Depression    Phreesia 01/28/2020     Current Outpatient Medications  Medication Sig Dispense Refill  . buPROPion (WELLBUTRIN XL) 150 MG 24 hr tablet Take 1 tablet (150 mg total) by mouth daily. 90 tablet 1  . LORazepam (ATIVAN) 0.5 MG tablet Take 1 tablet (0.5 mg total) by mouth every 8 (eight) hours as needed for anxiety. 12 tablet 0  . fluticasone (FLONASE) 50 MCG/ACT nasal spray Place 2 sprays into both nostrils daily. (Patient not taking: No sig reported) 16 g 6   No current facility-administered medications for this visit.    Allergies  Allergen Reactions  . Penicillins Hives    Per patient she was young    Social History   Socioeconomic History  . Marital status: Married    Spouse name: Not on file  . Number of children: Not on file  . Years of education: Not on file  . Highest education level: Not on file  Occupational History  . Not on file  Tobacco Use  . Smoking status: Former Smoker    Packs/day: 1.00    Years: 20.00    Pack years: 20.00    Types: Cigarettes    Quit date: 05/01/2011    Years since quitting: 8.9  . Smokeless tobacco: Never Used  Substance and Sexual Activity  . Alcohol use: Yes    Alcohol/week: 7.0 standard drinks    Types: 7 Glasses of wine per week  . Drug use: No  . Sexual activity: Yes    Birth control/protection: I.U.D.  Other Topics Concern  . Not on file  Social History Narrative  . Not on file   Social Determinants of Health   Financial Resource Strain: Not on file  Food Insecurity: Not on file  Transportation Needs: Not on file  Physical Activity: Not on file  Stress: Not on file  Social Connections: Not on file  Intimate Partner Violence: Not on file    ROS Per hpi   Objective   Vitals as reported by the patient: There were no vitals filed for this visit.  Retal was seen today for cough.  Diagnoses and all orders for this visit:  Flu-like symptoms -     Influenza A/B -     Novel Coronavirus, NAA ([facility]) -     oseltamivir (TAMIFLU) 75 MG  capsule; Take 1 capsule (75 mg total) by mouth 2 (two) times daily.  Other orders -     SARS-COV-2, NAA 2 DAY TAT   PLAN  Rapid flu  covid test  Preemptive ordering of tamiflu as I strongly suspect influenza over covid  Will present for drive up testing  Isolate until test results are back, then as test results indicate  Red flags and ER precautions reviewed, patient voices understanding  Patient encouraged to call clinic with any questions, comments, or concerns.   I discussed the assessment and treatment plan with the patient. The patient was provided an opportunity to ask questions and all were answered. The patient agreed with the plan and demonstrated an understanding of the instructions.   The patient was advised to call back or seek an in-person evaluation if the symptoms worsen or if the condition fails to improve as anticipated.  I provided 15 minutes of non-face-to-face time during this encounter.  Janeece Agee, NP  Primary Care at [facility]

## 2020-04-09 NOTE — Patient Instructions (Signed)
° ° ° °  If you have lab work done today you will be contacted with your lab results within the next 2 weeks.  If you have not heard from us then please contact us. The fastest way to get your results is to register for My Chart. ° ° °IF you received an x-ray today, you will receive an invoice from [facility]. Please contact [facility] at [phone] with questions or concerns regarding your invoice.  ° °IF you received labwork today, you will receive an invoice from [facility]. Please contact [facility] at [phone] with questions or concerns regarding your invoice.  ° °Our billing staff will not be able to assist you with questions regarding bills from these companies. ° °You will be contacted with the lab results as soon as they are available. The fastest way to get your results is to activate your My Chart account. Instructions are located on the last page of this paperwork. If you have not heard from us regarding the results in 2 weeks, please contact this office. °  ° ° ° °

## 2020-04-10 LAB — NOVEL CORONAVIRUS, NAA: SARS-CoV-2, NAA: DETECTED — AB

## 2020-04-10 LAB — SARS-COV-2, NAA 2 DAY TAT

## 2020-04-18 ENCOUNTER — Encounter: Payer: Self-pay | Admitting: Registered Nurse

## 2020-07-03 ENCOUNTER — Ambulatory Visit (INDEPENDENT_AMBULATORY_CARE_PROVIDER_SITE_OTHER): Payer: 59 | Admitting: Emergency Medicine

## 2020-07-03 ENCOUNTER — Encounter: Payer: Self-pay | Admitting: Emergency Medicine

## 2020-07-03 ENCOUNTER — Other Ambulatory Visit: Payer: Self-pay

## 2020-07-03 VITALS — BP 137/93 | HR 56 | Temp 98.3°F | Resp 16 | Ht 73.0 in | Wt 214.0 lb

## 2020-07-03 DIAGNOSIS — F410 Panic disorder [episodic paroxysmal anxiety] without agoraphobia: Secondary | ICD-10-CM | POA: Diagnosis not present

## 2020-07-03 DIAGNOSIS — F411 Generalized anxiety disorder: Secondary | ICD-10-CM | POA: Diagnosis not present

## 2020-07-03 DIAGNOSIS — F331 Major depressive disorder, recurrent, moderate: Secondary | ICD-10-CM | POA: Diagnosis not present

## 2020-07-03 DIAGNOSIS — I10 Essential (primary) hypertension: Secondary | ICD-10-CM | POA: Diagnosis not present

## 2020-07-03 MED ORDER — ESCITALOPRAM OXALATE 10 MG PO TABS: 10.0000 mg | ORAL_TABLET | Freq: Every day | ORAL | 1 refills | Status: DC

## 2020-07-03 MED ORDER — PROPRANOLOL HCL ER 60 MG PO CP24: 60.0000 mg | ORAL_CAPSULE | Freq: Every day | ORAL | 3 refills | Status: DC

## 2020-07-03 NOTE — Patient Instructions (Addendum)
Stop Wellbutrin. Start Lexapro as prescribed. Start Inderal as prescribed. Follow-up with psychologist.    If you have lab work done today you will be contacted with your lab results within the next 2 weeks.  If you have not heard from [place] then please contact us. The fastest way to get your results is to register for My Chart.   IF you received an x-ray today, you will receive an invoice from [facility]. Please contact [facility] at [phone] with questions or concerns regarding your invoice.   IF you received labwork today, you will receive an invoice from [facility]. Please contact [facility] at [phone] with questions or concerns regarding your invoice.   Our billing staff will not be able to assist you with questions regarding bills from these companies.  You will be contacted with the lab results as soon as they are available. The fastest way to get your results is to activate your My Chart account. Instructions are located on the last page of this paperwork. If you have not heard from [place] regarding the results in 2 weeks, please contact this office.     [email or web address]  Generalized Anxiety Disorder, Adult Generalized anxiety disorder (GAD) is a mental health condition. Unlike normal worries, anxiety related to GAD is not triggered by a specific event. These worries do not fade or get better with time. GAD interferes with relationships, work, and school. GAD symptoms can vary from mild to severe. People with severe GAD can have intense waves of anxiety with physical symptoms that are similar to panic attacks. What are the causes? The exact cause of GAD is not known, but the following are believed to have an impact:  Differences in natural brain chemicals.  Genes passed down from parents to children.  Differences in the way threats are perceived.  Development during childhood.  Personality. What increases the risk? The following factors may make you  more likely to develop this condition:  Being female.  Having a family history of anxiety disorders.  Being very shy.  Experiencing very stressful life events, such as the death of a loved one.  Having a very stressful family environment. What are the signs or symptoms? People with GAD often worry excessively about many things in their lives, such as their health and family. Symptoms may also include:  Mental and emotional symptoms: ? Worrying excessively about natural disasters. ? Fear of being late. ? Difficulty concentrating. ? Fears that others are judging your performance.  Physical symptoms: ? Fatigue. ? Headaches, muscle tension, muscle twitches, trembling, or feeling shaky. ? Feeling like your heart is pounding or beating very fast. ? Feeling out of breath or like you cannot take a deep breath. ? Having trouble falling asleep or staying asleep, or experiencing restlessness. ? Sweating. ? Nausea, diarrhea, or irritable bowel syndrome (IBS).  Behavioral symptoms: ? Experiencing erratic moods or irritability. ? Avoidance of new situations. ? Avoidance of people. ? Extreme difficulty making decisions. How is this diagnosed? This condition is diagnosed based on your symptoms and medical history. You will also have a physical exam. Your health care provider may perform tests to rule out other possible causes of your symptoms. To be diagnosed with GAD, a person must have anxiety that:  Is out of his or her control.  Affects several different aspects of his or her life, such as work and relationships.  Causes distress that makes him or her unable to take part in normal activities.  Includes at least three  symptoms of GAD, such as restlessness, fatigue, trouble concentrating, irritability, muscle tension, or sleep problems. Before your health care provider can confirm a diagnosis of GAD, these symptoms must be present more days than they are not, and they must last for 6  months or longer. How is this treated? This condition may be treated with:  Medicine. Antidepressant medicine is usually prescribed for long-term daily control. Anti-anxiety medicines may be added in severe cases, especially when panic attacks occur.  Talk therapy (psychotherapy). Certain types of talk therapy can be helpful in treating GAD by providing support, education, and guidance. Options include: ? Cognitive behavioral therapy (CBT). People learn coping skills and self-calming techniques to ease their physical symptoms. They learn to identify unrealistic thoughts and behaviors and to replace them with more appropriate thoughts and behaviors. ? Acceptance and commitment therapy (ACT). This treatment teaches people how to be mindful as a way to cope with unwanted thoughts and feelings. ? Biofeedback. This process trains you to manage your body's response (physiological response) through breathing techniques and relaxation methods. You will work with a therapist while machines are used to monitor your physical symptoms.  Stress management techniques. These include yoga, meditation, and exercise. A mental health specialist can help determine which treatment is best for you. Some people see improvement with one type of therapy. However, other people require a combination of therapies.   Follow these instructions at home: Lifestyle  Maintain a consistent routine and schedule.  Anticipate stressful situations. Create a plan, and allow extra time to work with your plan.  Practice stress management or self-calming techniques that you have learned from your therapist or your health care provider. General instructions  Take over-the-counter and prescription medicines only as told by your health care provider.  Understand that you are likely to have setbacks. Accept this and be kind to yourself as you persist to take better care of yourself.  Recognize and accept your accomplishments, even if  you judge them as small.  Keep all follow-up visits as told by your health care provider. This is important. Contact a health care provider if:  Your symptoms do not get better.  Your symptoms get worse.  You have signs of depression, such as: ? A persistently sad or irritable mood. ? Loss of enjoyment in activities that used to bring you joy. ? Change in weight or eating. ? Changes in sleeping habits. ? Avoiding friends or family members. ? Loss of energy for normal tasks. ? Feelings of guilt or worthlessness. Get help right away if:  You have serious thoughts about hurting yourself or others. If you ever feel like you may hurt yourself or others, or have thoughts about taking your own life, get help right away. Go to your nearest emergency department or:  Call your local emergency services (911 in the [place]  Call a suicide crisis helpline, such as the [place] at 315-032-9964. This is open 24 hours a day in the [place]  Text the Crisis Text Line at (601)404-3251 (in the [place].). Summary  Generalized anxiety disorder (GAD) is a mental health condition that involves worry that is not triggered by a specific event.  People with GAD often worry excessively about many things in their lives, such as their health and family.  GAD may cause symptoms such as restlessness, trouble concentrating, sleep problems, frequent sweating, nausea, diarrhea, headaches, and trembling or muscle twitching.  A mental health specialist can help determine which treatment is best for you.  Some people see improvement with one type of therapy. However, other people require a combination of therapies. This information is not intended to replace advice given to you by your health care provider. Make sure you discuss any questions you have with your health care provider. Document Revised: 01/18/2019 Document Reviewed: 01/18/2019 Elsevier Patient Education  [place].

## 2020-07-03 NOTE — Assessment & Plan Note (Signed)
Elevated blood pressure readings at home and in the office. We will start Inderal extended release 60 mg daily.  It will have a dual effect with hypertension and control of panic attacks.

## 2020-07-03 NOTE — Progress Notes (Signed)
Stacy Bray 40 y.o.   Chief Complaint  Patient presents with  . Panic Attack    Patient wants to review Wellbutrin    HISTORY OF PRESENT ILLNESS: This is a 40 y.o. female complaining of persistent chronic anxiety and occasional panic attacks. Last seen by me last October and started on Wellbutrin as requested by patient.  Had to go to emergency room 5 days later with a panic attack.  Given Ativan in the ER and also a prescription but not working very well so she is not taking this medication. Also complaining of intermittent elevations of blood pressure readings. No other complaints or medical concerns. Depression screen Stacy Bray 2/9 07/03/2020 04/09/2020 01/31/2020  Decreased Interest 3 0 1  Down, Depressed, Hopeless 2 0 3  PHQ - 2 Score 5 0 4  Altered sleeping 3 - 3  Tired, decreased energy 3 - 3  Change in appetite 3 - 3  Feeling bad or failure about yourself  3 - 1  Trouble concentrating 3 - 3  Moving slowly or fidgety/restless 1 - 0  Suicidal thoughts 1 - 1  PHQ-9 Score 22 - 18  Difficult doing work/chores Very difficult - Very difficult     HPI   Prior to Admission medications   Medication Sig Start Date End Date Taking? Authorizing Provider  LORazepam (ATIVAN) 0.5 MG tablet Take 1 tablet (0.5 mg total) by mouth every 8 (eight) hours as needed for anxiety. 02/05/20 02/04/21 Yes Stacy Meadow, Stacy Bray  buPROPion (WELLBUTRIN XL) 150 MG 24 hr tablet Take 1 tablet (150 mg total) by mouth daily. 01/31/20 04/30/20  Stacy Pollen, Stacy Bray  fluticasone [facility]) 50 MCG/ACT nasal spray Place 2 sprays into both nostrils daily. Patient not taking: No sig reported 03/27/19   Stacy Bray, Stacy Bray, Stacy Bray    Allergies  Allergen Reactions  . Penicillins Hives    Per patient she was young    There are no problems to display for this patient.   Past Medical History:  Diagnosis Date  . Anxiety    [employer]reesia 01/28/2020  . Depression    [employer]reesia 01/28/2020    Past  Surgical History:  Procedure Laterality Date  . INTRAUTERINE DEVICE INSERTION  2009  . LITHOTRIPSY  2000   kidney stones    Social History   Socioeconomic History  . Marital status: Married    Spouse name: Not on file  . Number of children: Not on file  . Years of education: Not on file  . Highest education level: Not on file  Occupational History  . Not on file  Tobacco Use  . Smoking status: Former Smoker    Packs/day: 1.00    Years: 20.00    Pack years: 20.00    Types: Cigarettes    Quit date: 05/01/2011    Years since quitting: 9.1  . Smokeless tobacco: Never Used  Substance and Sexual Activity  . Alcohol use: Yes    Alcohol/week: 7.0 standard drinks    Types: 7 Glasses of wine per week  . Drug use: No  . Sexual activity: Yes    Birth control/protection: I.U.D.  Other Topics Concern  . Not on file  Social History Narrative  . Not on file   Social Determinants of Health   Financial Resource Strain: Not on file  Food Insecurity: Not on file  Transportation Needs: Not on file  Physical Activity: Not on file  Stress: Not on file  Social Connections: Not on file  Intimate  Partner Violence: Not on file    Family History  Problem Relation Age of Onset  . Cancer Mother        colon cancer, ovarian cancer  . Cancer Maternal Aunt        melanoma, bladder cancer  . Cancer Maternal Grandmother        Breast Cancer  . Colon cancer Maternal Grandmother   . Cancer Maternal Grandfather        colon cancer  . Breast cancer Paternal Aunt   . Breast cancer Paternal Grandmother      Review of Systems  Constitutional: Negative.  Negative for chills and fever.  HENT: Negative.  Negative for congestion and sore throat.   Respiratory: Negative.  Negative for cough and hemoptysis.   Cardiovascular: Negative.  Negative for chest pain and palpitations.  Gastrointestinal: Negative.  Negative for abdominal pain, diarrhea, nausea and vomiting.  Genitourinary: Negative.   Negative for dysuria and hematuria.  Musculoskeletal: Negative.  Negative for back pain, myalgias and neck pain.  Skin: Negative.   Neurological: Negative.  Negative for dizziness and headaches.  All other systems reviewed and are negative.   Today's Vitals   07/03/20 0840  BP: (!) 137/93  Pulse: (!) 56  Resp: 16  Temp: 98.3 F (36.8 C)  TempSrc: Temporal  SpO2: 99%  Weight: 214 lb (97.1 kg)  Height: 6\' 1"  (1.854 m)   Body mass index is 28.23 kg/m.  Physical Exam Vitals reviewed.  Constitutional:      Appearance: Normal appearance.  HENT:     Head: Normocephalic.  Eyes:     Extraocular Movements: Extraocular movements intact.     Pupils: Pupils are equal, round, and reactive to light.  Cardiovascular:     Rate and Rhythm: Normal rate.  Pulmonary:     Effort: Pulmonary effort is normal.  Musculoskeletal:        General: Normal range of motion.     Cervical back: Normal range of motion.  Skin:    General: Skin is warm and dry.     Capillary Refill: Capillary refill takes less than 2 seconds.  Neurological:     General: No focal deficit present.     Mental Status: She is alert and oriented to person, place, and time.  Psychiatric:        Mood and Affect: Mood normal.        Behavior: Behavior normal.     Comments: Started crying soon after starting conversation.      ASSESSMENT & PLAN: Generalized anxiety disorder Presently active.  Wellbutrin not helping.  Advised to stop it. We will start Lexapro 10 mg daily for 1 week and if tolerated will increase to 20 mg daily. Referral to psychologist placed today. Follow-up after psychologist evaluation.  Essential hypertension Elevated blood pressure readings at home and in the office. We will start Inderal extended release 60 mg daily.  It will have a dual effect with hypertension and control of panic attacks.  Stacy Bray was seen today for panic attack.  Diagnoses and all orders for this visit:  Generalized  anxiety disorder -     escitalopram (LEXAPRO) 10 MG tablet; Take 1 tablet (10 mg total) by mouth daily. If no side effects after 1 week, increase dose to 20 mg daily. -     propranolol ER (INDERAL LA) 60 MG 24 hr capsule; Take 1 capsule (60 mg total) by mouth daily. -     Ambulatory referral to Psychology  Panic  attacks -     escitalopram (LEXAPRO) 10 MG tablet; Take 1 tablet (10 mg total) by mouth daily. If no side effects after 1 week, increase dose to 20 mg daily. -     propranolol ER (INDERAL LA) 60 MG 24 hr capsule; Take 1 capsule (60 mg total) by mouth daily. -     Ambulatory referral to Psychology  Essential hypertension  Moderate episode of recurrent major depressive disorder [facility])    Patient Instructions   Stop Wellbutrin. Start Lexapro as prescribed. Start Inderal as prescribed. Follow-up with psychologist.    If you have lab work done today you will be contacted with your lab results within the next 2 weeks.  If you have not heard from [place] then please contact us. The fastest way to get your results is to register for My Chart.   IF you received an x-ray today, you will receive an invoice from [facility]. Please contact [facility] at [phone] with questions or concerns regarding your invoice.   IF you received labwork today, you will receive an invoice from [facility]. Please contact [facility] at [phone] with questions or concerns regarding your invoice.   Our billing staff will not be able to assist you with questions regarding bills from these companies.  You will be contacted with the lab results as soon as they are available. The fastest way to get your results is to activate your My Chart account. Instructions are located on the last page of this paperwork. If you have not heard from [place] regarding the results in 2 weeks, please contact this office.     [email or web address]">  Generalized Anxiety Disorder, Adult Generalized anxiety disorder  (GAD) is a mental health condition. Unlike normal worries, anxiety related to GAD is not triggered by a specific event. These worries do not fade or get better with time. GAD interferes with relationships, work, and school. GAD symptoms can vary from mild to severe. People with severe GAD can have intense waves of anxiety with physical symptoms that are similar to panic attacks. What are the causes? The exact cause of GAD is not known, but the following are believed to have an impact:  Differences in natural brain chemicals.  Genes passed down from parents to children.  Differences in the way threats are perceived.  Development during childhood.  Personality. What increases the risk? The following factors may make you more likely to develop this condition:  Being female.  Having a family history of anxiety disorders.  Being very shy.  Experiencing very stressful life events, such as the death of a loved one.  Having a very stressful family environment. What are the signs or symptoms? People with GAD often worry excessively about many things in their lives, such as their health and family. Symptoms may also include:  Mental and emotional symptoms: ? Worrying excessively about natural disasters. ? Fear of being late. ? Difficulty concentrating. ? Fears that others are judging your performance.  Physical symptoms: ? Fatigue. ? Headaches, muscle tension, muscle twitches, trembling, or feeling shaky. ? Feeling like your heart is pounding or beating very fast. ? Feeling out of breath or like you cannot take a deep breath. ? Having trouble falling asleep or staying asleep, or experiencing restlessness. ? Sweating. ? Nausea, diarrhea, or irritable bowel syndrome (IBS).  Behavioral symptoms: ? Experiencing erratic moods or irritability. ? Avoidance of new situations. ? Avoidance of people. ? Extreme difficulty making decisions. How is this diagnosed? This condition is  diagnosed based on your symptoms and medical history. You will also have a physical exam. Your health care provider may perform tests to rule out other possible causes of your symptoms. To be diagnosed with GAD, a person must have anxiety that:  Is out of his or her control.  Affects several different aspects of his or her life, such as work and relationships.  Causes distress that makes him or her unable to take part in normal activities.  Includes at least three symptoms of GAD, such as restlessness, fatigue, trouble concentrating, irritability, muscle tension, or sleep problems. Before your health care provider can confirm a diagnosis of GAD, these symptoms must be present more days than they are not, and they must last for 6 months or longer. How is this treated? This condition may be treated with:  Medicine. Antidepressant medicine is usually prescribed for long-term daily control. Anti-anxiety medicines may be added in severe cases, especially when panic attacks occur.  Talk therapy (psychotherapy). Certain types of talk therapy can be helpful in treating GAD by providing support, education, and guidance. Options include: ? Cognitive behavioral therapy (CBT). People learn coping skills and self-calming techniques to ease their physical symptoms. They learn to identify unrealistic thoughts and behaviors and to replace them with more appropriate thoughts and behaviors. ? Acceptance and commitment therapy (ACT). This treatment teaches people how to be mindful as a way to cope with unwanted thoughts and feelings. ? Biofeedback. This process trains you to manage your body's response (physiological response) through breathing techniques and relaxation methods. You will work with a therapist while machines are used to monitor your physical symptoms.  Stress management techniques. These include yoga, meditation, and exercise. A mental health specialist can help determine which treatment is best  for you. Some people see improvement with one type of therapy. However, other people require a combination of therapies.   Follow these instructions at home: Lifestyle  Maintain a consistent routine and schedule.  Anticipate stressful situations. Create a plan, and allow extra time to work with your plan.  Practice stress management or self-calming techniques that you have learned from your therapist or your health care provider. General instructions  Take over-the-counter and prescription medicines only as told by your health care provider.  Understand that you are likely to have setbacks. Accept this and be kind to yourself as you persist to take better care of yourself.  Recognize and accept your accomplishments, even if you judge them as small.  Keep all follow-up visits as told by your health care provider. This is important. Contact a health care provider if:  Your symptoms do not get better.  Your symptoms get worse.  You have signs of depression, such as: ? A persistently sad or irritable mood. ? Loss of enjoyment in activities that used to bring you joy. ? Change in weight or eating. ? Changes in sleeping habits. ? Avoiding friends or family members. ? Loss of energy for normal tasks. ? Feelings of guilt or worthlessness. Get help right away if:  You have serious thoughts about hurting yourself or others. If you ever feel like you may hurt yourself or others, or have thoughts about taking your own life, get help right away. Go to your nearest emergency department or:  Call your local emergency services (911 in the [place]  Call a suicide crisis helpline, such as the [place] at (724)107-9624. This is open 24 hours a day in the [place]  Text the Crisis  Text Line at 458 838 2185 (in the [place] Summary  Generalized anxiety disorder (GAD) is a mental health condition that involves worry that is not triggered by a specific event.  People with GAD  often worry excessively about many things in their lives, such as their health and family.  GAD may cause symptoms such as restlessness, trouble concentrating, sleep problems, frequent sweating, nausea, diarrhea, headaches, and trembling or muscle twitching.  A mental health specialist can help determine which treatment is best for you. Some people see improvement with one type of therapy. However, other people require a combination of therapies. This information is not intended to replace advice given to you by your health care provider. Make sure you discuss any questions you have with your health care provider. Document Revised: 01/18/2019 Document Reviewed: 01/18/2019 Elsevier Patient Education  2021 Elsevier Inc.      Agustina Caroli, Stacy Bray [facility]

## 2020-07-03 NOTE — Assessment & Plan Note (Signed)
Presently active.  Wellbutrin not helping.  Advised to stop it. We will start Lexapro 10 mg daily for 1 week and if tolerated will increase to 20 mg daily. Referral to psychologist placed today. Follow-up after psychologist evaluation.

## 2020-07-04 ENCOUNTER — Encounter (HOSPITAL_COMMUNITY): Payer: Self-pay

## 2020-07-04 ENCOUNTER — Emergency Department (HOSPITAL_COMMUNITY): Payer: 59

## 2020-07-04 ENCOUNTER — Ambulatory Visit: Payer: Self-pay | Admitting: *Deleted

## 2020-07-04 ENCOUNTER — Other Ambulatory Visit: Payer: Self-pay

## 2020-07-04 ENCOUNTER — Emergency Department (HOSPITAL_COMMUNITY)
Admission: EM | Admit: 2020-07-04 | Discharge: 2020-07-04 | Disposition: A | Discharge status: Home / Self Care | Payer: 59 | Attending: Emergency Medicine | Admitting: Emergency Medicine

## 2020-07-04 DIAGNOSIS — Z87891 Personal history of nicotine dependence: Secondary | ICD-10-CM | POA: Insufficient documentation

## 2020-07-04 DIAGNOSIS — T43225A Adverse effect of selective serotonin reuptake inhibitors, initial encounter: Secondary | ICD-10-CM | POA: Diagnosis not present

## 2020-07-04 DIAGNOSIS — R519 Headache, unspecified: Secondary | ICD-10-CM | POA: Diagnosis not present

## 2020-07-04 DIAGNOSIS — F410 Panic disorder [episodic paroxysmal anxiety] without agoraphobia: Secondary | ICD-10-CM | POA: Insufficient documentation

## 2020-07-04 DIAGNOSIS — T447X5A Adverse effect of beta-adrenoreceptor antagonists, initial encounter: Secondary | ICD-10-CM | POA: Insufficient documentation

## 2020-07-04 DIAGNOSIS — I10 Essential (primary) hypertension: Secondary | ICD-10-CM | POA: Diagnosis not present

## 2020-07-04 DIAGNOSIS — R0789 Other chest pain: Secondary | ICD-10-CM | POA: Diagnosis not present

## 2020-07-04 DIAGNOSIS — T50905A Adverse effect of unspecified drugs, medicaments and biological substances, initial encounter: Secondary | ICD-10-CM

## 2020-07-04 HISTORY — DX: Essential (primary) hypertension: I10

## 2020-07-04 LAB — CBC WITH DIFFERENTIAL/PLATELET
Abs Immature Granulocytes: 0.03 10*3/uL (ref 0.00–0.07)
Basophils Absolute: 0 10*3/uL (ref 0.0–0.1)
Basophils Relative: 1 %
Eosinophils Absolute: 0.1 10*3/uL (ref 0.0–0.5)
Eosinophils Relative: 1 %
HCT: 41 % (ref 36.0–46.0)
Hemoglobin: 14.1 g/dL (ref 12.0–15.0)
Immature Granulocytes: 0 %
Lymphocytes Relative: 22 %
Lymphs Abs: 1.6 10*3/uL (ref 0.7–4.0)
MCH: 35 pg — ABNORMAL HIGH (ref 26.0–34.0)
MCHC: 34.4 g/dL (ref 30.0–36.0)
MCV: 101.7 fL — ABNORMAL HIGH (ref 80.0–100.0)
Monocytes Absolute: 0.6 10*3/uL (ref 0.1–1.0)
Monocytes Relative: 8 %
Neutro Abs: 4.8 10*3/uL (ref 1.7–7.7)
Neutrophils Relative %: 68 %
Platelets: 198 10*3/uL (ref 150–400)
RBC: 4.03 MIL/uL (ref 3.87–5.11)
RDW: 11.8 % (ref 11.5–15.5)
WBC: 7.1 10*3/uL (ref 4.0–10.5)
nRBC: 0 % (ref 0.0–0.2)

## 2020-07-04 LAB — BASIC METABOLIC PANEL
Anion gap: 9 (ref 5–15)
BUN: 6 mg/dL (ref 6–20)
CO2: 24 mmol/L (ref 22–32)
Calcium: 9.5 mg/dL (ref 8.9–10.3)
Chloride: 107 mmol/L (ref 98–111)
Creatinine, Ser: 0.87 mg/dL (ref 0.44–1.00)
GFR, Estimated: >60 mL/min (ref >60)
Glucose, Bld: 105 mg/dL — ABNORMAL HIGH (ref 70–99)
Potassium: 3.6 mmol/L (ref 3.5–5.1)
Sodium: 140 mmol/L (ref 135–145)

## 2020-07-04 LAB — TROPONIN I (HIGH SENSITIVITY)
Troponin I (High Sensitivity): 3 ng/L (ref <18)
Troponin I (High Sensitivity): 3 ng/L (ref <18)

## 2020-07-04 LAB — HCG, QUANTITATIVE, PREGNANCY: hCG, Beta Chain, Quant, S: <1 m[IU]/mL (ref <5)

## 2020-07-04 MED ORDER — ACETAMINOPHEN 325 MG PO TABS
650.0000 mg | ORAL_TABLET | Freq: Once | ORAL | Status: AC
  Administered 2020-07-04: 650 mg via ORAL
  Filled 2020-07-04: qty 2

## 2020-07-04 MED ORDER — SODIUM CHLORIDE 0.9 % IV BOLUS
500.0000 mL | Freq: Once | INTRAVENOUS | Status: AC
  Administered 2020-07-04: 500 mL via INTRAVENOUS

## 2020-07-04 NOTE — Telephone Encounter (Signed)
Agree with advice. Thanks.

## 2020-07-04 NOTE — Telephone Encounter (Signed)
  Reason for Disposition . [1] Caller has URGENT medicine question about med that PCP or specialist prescribed AND [2] triager unable to answer question  Answer Assessment - Initial Assessment Questions 1. NAME of MEDICATION: "What medicine are you calling about?"     Lexapro and Inderal 2. QUESTION: "What is your question?" (e.g., medication refill, side effect)     Patient is calling to state she is having SE- "drunk-high" feeling 3. PRESCRIBING HCP: "Who prescribed it?" Reason: if prescribed by specialist, call should be referred to that group.     PCP 4. SYMPTOMS: "Do you have any symptoms?"     Patient is feeling "drunk- high" on medications 5. SEVERITY: If symptoms are present, ask "Are they mild, moderate or severe?"     Effecting work 6. PREGNANCY:  "Is there any chance that you are pregnant?" "When was your last menstrual period?"     n/a  Protocols used: MEDICATION QUESTION CALL-A-AH

## 2020-07-04 NOTE — Telephone Encounter (Signed)
Patient just started new medications- she states she is feeling swimmy headed- drunk feeling

## 2020-07-04 NOTE — Discharge Instructions (Addendum)
I believe her symptoms may have been caused by the medications that you started today. It is important for you to follow-up with your primary care provider and let them know of these possible side effects you experienced. In the meantime I would stop taking your medications. Return to the ER if you start to experience worsening chest pain, shortness of breath, blurry vision, numbness in arms or legs.

## 2020-07-04 NOTE — ED Provider Notes (Signed)
[facility] DEPT Provider Note   CSN: [id number] Arrival date & time: 07/04/20  1630     History Chief Complaint  Patient presents with  . Allergic Reaction    Stacy Bray is a 40 y.o. female with a past medical history of anxiety, depression, hypertension presenting to the ED with a chief complaint of possible reaction to medication.  States that she has been dealing with panic attacks for several years of her life.  She saw her PCP yesterday and was prescribed propranolol and Lexapro to help with panic attacks but also with her documented high blood pressure readings at home.  She took both of these medications for the first time around 9:30 AM.  Several hours later felt like she was having a reaction to these, feeling "like I am a float, like that high or something, my chest was pounding and I had a headache."  Reports associated diarrhea states that she took her blood pressure twice and found to be in the 409W systolic.  She told her PCP who advised her to go to the ER because "my doctor is moving to a new building and he will be there until April." Patient states that she is also having numbness in bilateral hands and feeling like she has having another panic attack when she walked into the ER.  HPI     Past Medical History:  Diagnosis Date  . Anxiety    Phreesia 01/28/2020  . Depression    Phreesia 01/28/2020  . Hypertension     Patient Active Problem List   Diagnosis Date Noted  . Generalized anxiety disorder 07/03/2020  . Panic attacks 07/03/2020  . Essential hypertension 07/03/2020  . Moderate episode of recurrent major depressive disorder ([place]) 07/03/2020    Past Surgical History:  Procedure Laterality Date  . INTRAUTERINE DEVICE INSERTION  2009  . LITHOTRIPSY  2000   kidney stones     OB History   No obstetric history on file.     Family History  Problem Relation Age of Onset  . Cancer Mother        colon cancer,  ovarian cancer  . Cancer Maternal Aunt        melanoma, bladder cancer  . Cancer Maternal Grandmother        Breast Cancer  . Colon cancer Maternal Grandmother   . Cancer Maternal Grandfather        colon cancer  . Breast cancer Paternal Aunt   . Breast cancer Paternal Grandmother     Social History   Tobacco Use  . Smoking status: Former Smoker    Packs/day: 1.00    Years: 20.00    Pack years: 20.00    Types: Cigarettes    Quit date: 05/01/2011    Years since quitting: 9.1  . Smokeless tobacco: Never Used  Vaping Use  . Vaping Use: Never used  Substance Use Topics  . Alcohol use: Yes    Alcohol/week: 7.0 standard drinks    Types: 7 Glasses of wine per week  . Drug use: No    Home Medications Prior to Admission medications   Medication Sig Start Date End Date Taking? Authorizing Provider  escitalopram (LEXAPRO) 10 MG tablet Take 1 tablet (10 mg total) by mouth daily. If no side effects after 1 week, increase dose to 20 mg daily. 07/03/20 10/01/20  Horald Pollen, MD  LORazepam (ATIVAN) 0.5 MG tablet Take 1 tablet (0.5 mg total) by mouth every 8 (  eight) hours as needed for anxiety. 02/05/20 02/04/21  Fransico Meadow, PA-C  propranolol ER (INDERAL LA) 60 MG 24 hr capsule Take 1 capsule (60 mg total) by mouth daily. 07/03/20 10/01/20  Horald Pollen, MD    Allergies    Penicillins  Review of Systems   Review of Systems  Constitutional: Negative for appetite change, chills and fever.  HENT: Negative for ear pain, rhinorrhea, sneezing and sore throat.   Eyes: Negative for photophobia and visual disturbance.  Respiratory: Positive for chest tightness. Negative for cough, shortness of breath and wheezing.   Cardiovascular: Negative for chest pain and palpitations.  Gastrointestinal: Positive for diarrhea. Negative for abdominal pain, blood in stool, constipation, nausea and vomiting.  Genitourinary: Negative for dysuria, hematuria and urgency.  Musculoskeletal:  Negative for myalgias.  Skin: Negative for rash.  Neurological: Positive for numbness and headaches. Negative for dizziness, weakness and light-headedness.    Physical Exam Updated Vital Signs BP (!) 165/100   Pulse 63   Temp 98 F (36.7 C) (Oral)   Resp 18   Ht 6\' 1"  (1.854 m)   Wt 97.1 kg   SpO2 98%   BMI 28.23 kg/m   Physical Exam Vitals and nursing note reviewed.  Constitutional:      General: She is not in acute distress.    Appearance: She is well-developed.     Comments: Speaking complete sentences.  Does appear anxious.  HENT:     Head: Normocephalic and atraumatic.     Nose: Nose normal.  Eyes:     General: No scleral icterus.       Right eye: No discharge.        Left eye: No discharge.     Conjunctiva/sclera: Conjunctivae normal.     Pupils: Pupils are equal, round, and reactive to light.  Cardiovascular:     Rate and Rhythm: Normal rate and regular rhythm.     Heart sounds: Normal heart sounds. No murmur heard. No friction rub. No gallop.   Pulmonary:     Effort: Pulmonary effort is normal. No respiratory distress.     Breath sounds: Normal breath sounds.  Abdominal:     General: Bowel sounds are normal. There is no distension.     Palpations: Abdomen is soft.     Tenderness: There is no abdominal tenderness. There is no guarding.  Musculoskeletal:        General: Normal range of motion.     Cervical back: Normal range of motion and neck supple.  Skin:    General: Skin is warm and dry.     Findings: No rash.  Neurological:     Mental Status: She is alert and oriented to person, place, and time.     Cranial Nerves: No cranial nerve deficit.     Sensory: No sensory deficit.     Motor: No weakness or abnormal muscle tone.     Coordination: Coordination normal.     Comments: Pupils reactive. No facial asymmetry noted. Cranial nerves appear grossly intact. Sensation intact to light touch on face, BUE and BLE. Strength 5/5 in BUE and BLE.      ED  Results / Procedures / Treatments   Labs (all labs ordered are listed, but only abnormal results are displayed) Labs Reviewed  BASIC METABOLIC PANEL - Abnormal; Notable for the following components:      Result Value   Glucose, Bld 105 (*)    All other components within normal limits  CBC  WITH DIFFERENTIAL/PLATELET - Abnormal; Notable for the following components:   MCV 101.7 (*)    MCH 35.0 (*)    All other components within normal limits  HCG, QUANTITATIVE, PREGNANCY  TROPONIN I (HIGH SENSITIVITY)  TROPONIN I (HIGH SENSITIVITY)    EKG None  Radiology DG Chest 2 View  Result Date: 07/04/2020 CLINICAL DATA:  Shortness of breath EXAM: CHEST - 2 VIEW COMPARISON:  None. FINDINGS: The heart size and mediastinal contours are within normal limits. Both lungs are clear. The visualized skeletal structures are unremarkable. IMPRESSION: Negative. Electronically Signed   By: Rolm Baptise M.D.   On: 07/04/2020 18:33    Procedures Procedures   Medications Ordered in ED Medications  sodium chloride 0.9 % bolus 500 mL (0 mLs Intravenous Stopped 07/04/20 1950)  acetaminophen (TYLENOL) tablet 650 mg (650 mg Oral Given 07/04/20 1725)    ED Course  I have reviewed the triage vital signs and the nursing notes.  Pertinent labs & imaging results that were available during my care of the patient were reviewed by me and considered in my medical decision making (see chart for details).  Clinical Course as of 07/04/20 2108  Thu Jul 04, 2020  1811 Troponin I (High Sensitivity): 3 [HK]  1836 Creatinine: 0.87 [HK]  1836 Patient with improvement in symptoms with medications given.  She is resting comfortably. [HK]  2102 Troponin I (High Sensitivity): 3 [HK]    Clinical Course User Index [HK] Delia Heady, PA-C   MDM Rules/Calculators/A&P                          40 year old female with past medical history of hypertension and anxiety presenting to the ED for possible reaction to medication.  Saw  her PCP yesterday and was started on propranolol and Lexapro to help with both anxiety and hypertension.  She took this medication around 9:30 AM today and a couple of hours later started experiencing feeling like she was "afloat" having chest tightness and headache.  She called her PCP who advised her to come to the ER as she may have been having a reaction to the medication.  Patient states that she had a worsening panic attack when she walked into the ER.  She continues to have a headache and tightness in her chest.  These are all intermittent.  Patient without neurological deficits on exam.  She appears calm but will intermittently appear anxious when speaking about her symptoms.  She has no lower extremity edema, erythema or calf tenderness bilaterally.  Abdomen is soft.  She is not hypoxic or tachycardic.  Will check lab work, EKG and reassess after Tylenol and IV fluids.  Initial troponin unremarkable.  Creatinine is normal.  CBC is unremarkable.  EKG shows sinus rhythm, no ischemic changes, no STEMI.  Chest x-ray is unremarkable.  Suspect that symptoms are due to patient's recent medication use.  Informed her of possible side effects of beta-blockers.  She has remained here for 4-1/2 hours with significant improvement in her symptoms.  She remains ambulatory.  Her blood pressures have been in the 1 82-9 60 systolic but unsure if this is more so related to anxiety.  I did inform her that while we do not typically advise patient to discontinue medications prescribed by their PCP, I do believe in this case that her symptoms may have been due to side effects.  I explained to her the effects of high blood pressure on the symptoms  she is feeling as well as symptoms that could cause high blood pressure.  Patient understands and agrees to follow-up with your PCP.  Encouraged her to follow-up with the psychiatrist as well.  She remains in no acute distress here.  Her belly other vital signs within normal limits.   Return precautions given.   Patient is hemodynamically stable, in NAD, and able to ambulate in the ED. Evaluation does not show pathology that would require ongoing emergent intervention or inpatient treatment. I explained the diagnosis to the patient. Pain has been managed and has no complaints prior to discharge. Patient is comfortable with above plan and is stable for discharge at this time. All questions were answered prior to disposition. Strict return precautions for returning to the ED were discussed. Encouraged follow up with PCP.   An After Visit Summary was printed and given to the patient.   Portions of this note were generated with [employer]. Dictation errors may occur despite best attempts at proofreading.  Final Clinical Impression(s) / ED Diagnoses Final diagnoses:  Adverse effect of drug, initial encounter    Rx / DC Orders ED Discharge Orders    None       Delia Heady, Hershal Coria 07/04/20 2108    Dorie Rank, MD 07/05/20 [phone]

## 2020-07-04 NOTE — Telephone Encounter (Addendum)
Patient is calling back- she had to leave work because she wasn't feeling well- her BP is up and she is having headache, feeling afloat-advised ED for evaluation per protocol. Reason for Disposition . [6] Systolic BP  >= 580 OR Diastolic >= 063 AND [4] cardiac or neurologic symptoms (e.g., chest pain, difficulty breathing, unsteady gait, blurred vision)  Answer Assessment - Initial Assessment Questions 1. BLOOD PRESSURE: "What is the blood pressure?" "Did you take at least two measurements 5 minutes apart?"     157/105 P 60, 151/106 P 64 2. ONSET: "When did you take your blood pressure?"     4:00, 4:07 3. HOW: "How did you obtain the blood pressure?" (e.g., visiting nurse, automatic home BP monitor)     Manual  4. HISTORY: "Do you have a history of high blood pressure?"     Using Inderal for BP regulation 5. MEDICATIONS: "Are you taking any medications for blood pressure?" "Have you missed any doses recently?"     Yes- 1st day is today 6. OTHER SYMPTOMS: "Do you have any symptoms?" (e.g., headache, chest pain, blurred vision, difficulty breathing, weakness)     Headache, chest pain 7. PREGNANCY: "Is there any chance you are pregnant?" "When was your last menstrual period?"     n/a  Protocols used: BLOOD PRESSURE - HIGH-A-AH

## 2020-07-04 NOTE — Telephone Encounter (Signed)
Left a msg on patient machine that she would need to go to the urgent care or to the ED to get evaluated. Dr Kittie Plater will be out of the office tomorrow and he did not want patient to go throught the weekend without being seen. I also stated [facility] will be closing iot office tomorrow 07/05/20 for good and after today she would need to reach him at his new location [facility]

## 2020-07-04 NOTE — ED Triage Notes (Signed)
Patient states she is having a reaction to 2 new BP meds and states that she took them around 0930 today. Patient states she "felt like she was floating and her chest did not feel right." Patient also states she is "having a panic attack"

## 2020-07-04 NOTE — Telephone Encounter (Signed)
Patient is feeling swimming headed after take rx that was given at office visit please advise

## 2020-07-24 ENCOUNTER — Other Ambulatory Visit: Payer: Self-pay | Admitting: Emergency Medicine

## 2020-07-24 DIAGNOSIS — F331 Major depressive disorder, recurrent, moderate: Secondary | ICD-10-CM

## 2020-09-18 ENCOUNTER — Ambulatory Visit (INDEPENDENT_AMBULATORY_CARE_PROVIDER_SITE_OTHER): Payer: 59 | Admitting: Emergency Medicine

## 2020-09-18 ENCOUNTER — Encounter: Payer: Self-pay | Admitting: Emergency Medicine

## 2020-09-18 ENCOUNTER — Other Ambulatory Visit: Payer: Self-pay

## 2020-09-18 VITALS — BP 138/80 | HR 101 | Temp 99.0°F | Ht 73.0 in | Wt 211.2 lb

## 2020-09-18 DIAGNOSIS — I10 Essential (primary) hypertension: Secondary | ICD-10-CM

## 2020-09-18 DIAGNOSIS — F410 Panic disorder [episodic paroxysmal anxiety] without agoraphobia: Secondary | ICD-10-CM

## 2020-09-18 DIAGNOSIS — F411 Generalized anxiety disorder: Secondary | ICD-10-CM | POA: Diagnosis not present

## 2020-09-18 MED ORDER — BUSPIRONE HCL 7.5 MG PO TABS: 7.5000 mg | ORAL_TABLET | Freq: Two times a day (BID) | ORAL | 1 refills | Status: AC

## 2020-09-18 NOTE — Assessment & Plan Note (Addendum)
Blood pressure readings at home within normal limits. Questionable side effects from propranolol. Advised to continue monitoring blood pressure readings at home and contact the office if persistently elevated.  May need medication.

## 2020-09-18 NOTE — Assessment & Plan Note (Signed)
Presently active with occasional panic attacks.  Side effects from Lexapro. Needs psychiatric evaluation.  States she was contacted but could not retrieve message.  Will provide information or place second referral. In the meantime we need to treat her symptoms.  It is affecting her quality of life. Will start BuSpar 7.5 mg twice a day.

## 2020-09-18 NOTE — Patient Instructions (Signed)
[email or web address].[email or web address].Gov">  Generalized Anxiety Disorder, Adult Generalized anxiety disorder (GAD) is a mental health condition. Unlike normal worries, anxiety related to GAD is not triggered by a specific event. These worries do not fade or get better with time. GAD interferes with relationships, work, and school. GAD symptoms can vary from mild to severe. People with severe GAD can have intense waves of anxiety with physical symptoms that are similar to panic attacks. What are the causes? The exact cause of GAD is not known, but the following are believed to have an impact:  Differences in natural brain chemicals.  Genes passed down from parents to children.  Differences in the way threats are perceived.  Development during childhood.  Personality. What increases the risk? The following factors may make you more likely to develop this condition:  Being female.  Having a family history of anxiety disorders.  Being very shy.  Experiencing very stressful life events, such as the death of a loved one.  Having a very stressful family environment. What are the signs or symptoms? People with GAD often worry excessively about many things in their lives, such as their health and family. Symptoms may also include:  Mental and emotional symptoms: ? Worrying excessively about natural disasters. ? Fear of being late. ? Difficulty concentrating. ? Fears that others are judging your performance.  Physical symptoms: ? Fatigue. ? Headaches, muscle tension, muscle twitches, trembling, or feeling shaky. ? Feeling like your heart is pounding or beating very fast. ? Feeling out of breath or like you cannot take a deep breath. ? Having trouble falling asleep or staying asleep, or experiencing restlessness. ? Sweating. ? Nausea, diarrhea, or irritable bowel syndrome (IBS).  Behavioral symptoms: ? Experiencing erratic moods or irritability. ? Avoidance of new situations. ? Avoidance of  people. ? Extreme difficulty making decisions. How is this diagnosed? This condition is diagnosed based on your symptoms and medical history. You will also have a physical exam. Your health care provider may perform tests to rule out other possible causes of your symptoms. To be diagnosed with GAD, a person must have anxiety that:  Is out of his or her control.  Affects several different aspects of his or her life, such as work and relationships.  Causes distress that makes him or her unable to take part in normal activities.  Includes at least three symptoms of GAD, such as restlessness, fatigue, trouble concentrating, irritability, muscle tension, or sleep problems. Before your health care provider can confirm a diagnosis of GAD, these symptoms must be present more days than they are not, and they must last for 6 months or longer. How is this treated? This condition may be treated with:  Medicine. Antidepressant medicine is usually prescribed for long-term daily control. Anti-anxiety medicines may be added in severe cases, especially when panic attacks occur.  Talk therapy (psychotherapy). Certain types of talk therapy can be helpful in treating GAD by providing support, education, and guidance. Options include: ? Cognitive behavioral therapy (CBT). People learn coping skills and self-calming techniques to ease their physical symptoms. They learn to identify unrealistic thoughts and behaviors and to replace them with more appropriate thoughts and behaviors. ? Acceptance and commitment therapy (ACT). This treatment teaches people how to be mindful as a way to cope with unwanted thoughts and feelings. ? Biofeedback. This process trains you to manage your body's response (physiological response) through breathing techniques and relaxation methods. You will work with a therapist while machines are used to monitor your physical   symptoms.  Stress management techniques. These include yoga,  meditation, and exercise. A mental health specialist can help determine which treatment is best for you. Some people see improvement with one type of therapy. However, other people require a combination of therapies.   Follow these instructions at home: Lifestyle  Maintain a consistent routine and schedule.  Anticipate stressful situations. Create a plan, and allow extra time to work with your plan.  Practice stress management or self-calming techniques that you have learned from your therapist or your health care provider. General instructions  Take over-the-counter and prescription medicines only as told by your health care provider.  Understand that you are likely to have setbacks. Accept this and be kind to yourself as you persist to take better care of yourself.  Recognize and accept your accomplishments, even if you judge them as small.  Keep all follow-up visits as told by your health care provider. This is important. Contact a health care provider if:  Your symptoms do not get better.  Your symptoms get worse.  You have signs of depression, such as: ? A persistently sad or irritable mood. ? Loss of enjoyment in activities that used to bring you joy. ? Change in weight or eating. ? Changes in sleeping habits. ? Avoiding friends or family members. ? Loss of energy for normal tasks. ? Feelings of guilt or worthlessness. Get help right away if:  You have serious thoughts about hurting yourself or others. If you ever feel like you may hurt yourself or others, or have thoughts about taking your own life, get help right away. Go to your nearest emergency department or:  Call your local emergency services (911 in the [place]  Call a suicide crisis helpline, such as the National Suicide Prevention Lifeline at 1-800-273-8255. This is open 24 hours a day in the [place]  Text the Crisis Text Line at 741741 (in the [place] Summary  Generalized anxiety disorder (GAD) is a mental  health condition that involves worry that is not triggered by a specific event.  People with GAD often worry excessively about many things in their lives, such as their health and family.  GAD may cause symptoms such as restlessness, trouble concentrating, sleep problems, frequent sweating, nausea, diarrhea, headaches, and trembling or muscle twitching.  A mental health specialist can help determine which treatment is best for you. Some people see improvement with one type of therapy. However, other people require a combination of therapies. This information is not intended to replace advice given to you by your health care provider. Make sure you discuss any questions you have with your health care provider. Document Revised: 01/18/2019 Document Reviewed: 01/18/2019 Elsevier Patient Education  2021 [employer].  

## 2020-09-18 NOTE — Progress Notes (Signed)
Stacy Bray 40 y.o.   Chief Complaint  Patient presents with  . Anxiety    Before and continue after 07/04/2019 visit    HISTORY OF PRESENT ILLNESS: This is a 40 y.o. female complaining of anxiety panic attacks on and off for several months. Has history of generalized anxiety disorder.  Has not been able to schedule appointment with psychiatry yet. Seen by me last March and started on Lexapro and Inderal LA but developed side effects and ended up in the emergency department. Not taking any medications at present time. Symptoms persist. Depression screen [facility] 2/9 07/03/2020 04/09/2020 01/31/2020  Decreased Interest 3 0 1  Down, Depressed, Hopeless 2 0 3  PHQ - 2 Score 5 0 4  Altered sleeping 3 - 3  Tired, decreased energy 3 - 3  Change in appetite 3 - 3  Feeling bad or failure about yourself  3 - 1  Trouble concentrating 3 - 3  Moving slowly or fidgety/restless 1 - 0  Suicidal thoughts 1 - 1  PHQ-9 Score 22 - 18  Difficult doing work/chores Very difficult - Very difficult     HPI   Prior to Admission medications   Medication Sig Start Date End Date Taking? Authorizing Provider  escitalopram (LEXAPRO) 10 MG tablet Take 1 tablet (10 mg total) by mouth daily. If no side effects after 1 week, increase dose to 20 mg daily. Patient not taking: Reported on 09/18/2020 07/03/20 10/01/20  Horald Pollen, MD  LORazepam (ATIVAN) 0.5 MG tablet Take 1 tablet (0.5 mg total) by mouth every 8 (eight) hours as needed for anxiety. Patient not taking: Reported on 09/18/2020 02/05/20 02/04/21  Fransico Meadow, PA-C  propranolol ER (INDERAL LA) 60 MG 24 hr capsule Take 1 capsule (60 mg total) by mouth daily. Patient not taking: Reported on 09/18/2020 07/03/20 10/01/20  Horald Pollen, MD    Allergies  Allergen Reactions  . Penicillins Hives    Per patient she was young    Patient Active Problem List   Diagnosis Date Noted  . Generalized anxiety disorder 07/03/2020  . Panic  attacks 07/03/2020  . Essential hypertension 07/03/2020  . Moderate episode of recurrent major depressive disorder ([place]) 07/03/2020    Past Medical History:  Diagnosis Date  . Anxiety    Phreesia 01/28/2020  . Depression    Phreesia 01/28/2020  . Hypertension     Past Surgical History:  Procedure Laterality Date  . INTRAUTERINE DEVICE INSERTION  2009  . LITHOTRIPSY  2000   kidney stones    Social History   Socioeconomic History  . Marital status: Married    Spouse name: Not on file  . Number of children: Not on file  . Years of education: Not on file  . Highest education level: Not on file  Occupational History  . Not on file  Tobacco Use  . Smoking status: Former Smoker    Packs/day: 1.00    Years: 20.00    Pack years: 20.00    Types: Cigarettes    Quit date: 05/01/2011    Years since quitting: 9.3  . Smokeless tobacco: Never Used  Vaping Use  . Vaping Use: Never used  Substance and Sexual Activity  . Alcohol use: Yes    Alcohol/week: 7.0 standard drinks    Types: 7 Glasses of wine per week  . Drug use: No  . Sexual activity: Yes    Birth control/protection: I.U.D.  Other Topics Concern  . Not on file  Social History Narrative  . Not on file   Social Determinants of Health   Financial Resource Strain: Not on file  Food Insecurity: Not on file  Transportation Needs: Not on file  Physical Activity: Not on file  Stress: Not on file  Social Connections: Not on file  Intimate Partner Violence: Not on file    Family History  Problem Relation Age of Onset  . Cancer Mother        colon cancer, ovarian cancer  . Cancer Maternal Aunt        melanoma, bladder cancer  . Cancer Maternal Grandmother        Breast Cancer  . Colon cancer Maternal Grandmother   . Cancer Maternal Grandfather        colon cancer  . Breast cancer Paternal Aunt   . Breast cancer Paternal Grandmother      Review of Systems  Constitutional: Negative.  Negative for chills and  fever.  HENT: Negative.  Negative for congestion and sore throat.   Respiratory: Negative.  Negative for cough and shortness of breath.   Cardiovascular: Negative.  Negative for chest pain and palpitations.  Gastrointestinal: Negative for abdominal pain, diarrhea, nausea and vomiting.  Genitourinary: Negative.  Negative for dysuria and hematuria.  Skin: Negative.   Neurological: Negative.  Negative for dizziness and headaches.  All other systems reviewed and are negative.   Today's Vitals   09/18/20 1609  BP: 140/88  Pulse: (!) 101  Temp: 99 F (37.2 C)  TempSrc: Oral  Weight: 211 lb 3.2 oz (95.8 kg)  Height: 6\' 1"  (1.854 m)   Body mass index is 27.86 kg/m.  Physical Exam Vitals reviewed.  Constitutional:      Appearance: Normal appearance.  HENT:     Head: Normocephalic.     Mouth/Throat:     Mouth: Mucous membranes are moist.     Pharynx: Oropharynx is clear.  Eyes:     Extraocular Movements: Extraocular movements intact.     Conjunctiva/sclera: Conjunctivae normal.     Pupils: Pupils are equal, round, and reactive to light.  Cardiovascular:     Rate and Rhythm: Normal rate and regular rhythm.     Pulses: Normal pulses.     Heart sounds: Normal heart sounds.  Pulmonary:     Effort: Pulmonary effort is normal.     Breath sounds: Normal breath sounds.  Abdominal:     General: Bowel sounds are normal. There is no distension.     Palpations: Abdomen is soft.     Tenderness: There is no abdominal tenderness.  Musculoskeletal:        General: Normal range of motion.     Cervical back: Normal range of motion and neck supple.  Skin:    General: Skin is warm and dry.     Capillary Refill: Capillary refill takes less than 2 seconds.  Neurological:     General: No focal deficit present.     Mental Status: She is alert and oriented to person, place, and time.  Psychiatric:        Mood and Affect: Mood normal.        Behavior: Behavior normal.      ASSESSMENT &  PLAN: A total of 30 minutes was spent with the patient and counseling/coordination of care regarding generalized anxiety disorder/panic attacks and its treatment, need for psychiatric evaluation, need for symptom control with new medication BuSpar 7.5 mg twice a day, need to monitor your blood pressure readings  at home daily for the next several weeks and keep a log, education on nutrition, anxiety management advice, review of emergency department visit notes, review of most recent office visit note, review of most recent blood work results, prognosis, documentation and need for follow-up.  Essential hypertension Blood pressure readings at home within normal limits. Questionable side effects from propranolol. Advised to continue monitoring blood pressure readings at home and contact the office if persistently elevated.  May need medication.  Generalized anxiety disorder Presently active with occasional panic attacks.  Side effects from Lexapro. Needs psychiatric evaluation.  States she was contacted but could not retrieve message.  Will provide information or place second referral. In the meantime we need to treat her symptoms.  It is affecting her quality of life. Will start BuSpar 7.5 mg twice a day.  Laurie was seen today for anxiety.  Diagnoses and all orders for this visit:  Generalized anxiety disorder -     busPIRone (BUSPAR) 7.5 MG tablet; Take 1 tablet (7.5 mg total) by mouth 2 (two) times daily.  Panic attacks -     busPIRone (BUSPAR) 7.5 MG tablet; Take 1 tablet (7.5 mg total) by mouth 2 (two) times daily.  Essential hypertension    Patient Instructions   [email or web address]  Generalized Anxiety Disorder, Adult Generalized anxiety disorder (GAD) is a mental health condition. Unlike normal worries, anxiety related to GAD is not triggered by a specific event. These worries do not fade or get better with time. GAD interferes with relationships, work, and school. GAD  symptoms can vary from mild to severe. People with severe GAD can have intense waves of anxiety with physical symptoms that are similar to panic attacks. What are the causes? The exact cause of GAD is not known, but the following are believed to have an impact:  Differences in natural brain chemicals.  Genes passed down from parents to children.  Differences in the way threats are perceived.  Development during childhood.  Personality. What increases the risk? The following factors may make you more likely to develop this condition:  Being female.  Having a family history of anxiety disorders.  Being very shy.  Experiencing very stressful life events, such as the death of a loved one.  Having a very stressful family environment. What are the signs or symptoms? People with GAD often worry excessively about many things in their lives, such as their health and family. Symptoms may also include:  Mental and emotional symptoms: ? Worrying excessively about natural disasters. ? Fear of being late. ? Difficulty concentrating. ? Fears that others are judging your performance.  Physical symptoms: ? Fatigue. ? Headaches, muscle tension, muscle twitches, trembling, or feeling shaky. ? Feeling like your heart is pounding or beating very fast. ? Feeling out of breath or like you cannot take a deep breath. ? Having trouble falling asleep or staying asleep, or experiencing restlessness. ? Sweating. ? Nausea, diarrhea, or irritable bowel syndrome (IBS).  Behavioral symptoms: ? Experiencing erratic moods or irritability. ? Avoidance of new situations. ? Avoidance of people. ? Extreme difficulty making decisions. How is this diagnosed? This condition is diagnosed based on your symptoms and medical history. You will also have a physical exam. Your health care provider may perform tests to rule out other possible causes of your symptoms. To be diagnosed with GAD, a person must have  anxiety that:  Is out of his or her control.  Affects several different aspects of his or her life, such as  work and relationships.  Causes distress that makes him or her unable to take part in normal activities.  Includes at least three symptoms of GAD, such as restlessness, fatigue, trouble concentrating, irritability, muscle tension, or sleep problems. Before your health care provider can confirm a diagnosis of GAD, these symptoms must be present more days than they are not, and they must last for 6 months or longer. How is this treated? This condition may be treated with:  Medicine. Antidepressant medicine is usually prescribed for long-term daily control. Anti-anxiety medicines may be added in severe cases, especially when panic attacks occur.  Talk therapy (psychotherapy). Certain types of talk therapy can be helpful in treating GAD by providing support, education, and guidance. Options include: ? Cognitive behavioral therapy (CBT). People learn coping skills and self-calming techniques to ease their physical symptoms. They learn to identify unrealistic thoughts and behaviors and to replace them with more appropriate thoughts and behaviors. ? Acceptance and commitment therapy (ACT). This treatment teaches people how to be mindful as a way to cope with unwanted thoughts and feelings. ? Biofeedback. This process trains you to manage your body's response (physiological response) through breathing techniques and relaxation methods. You will work with a therapist while machines are used to monitor your physical symptoms.  Stress management techniques. These include yoga, meditation, and exercise. A mental health specialist can help determine which treatment is best for you. Some people see improvement with one type of therapy. However, other people require a combination of therapies.   Follow these instructions at home: Lifestyle  Maintain a consistent routine and schedule.  Anticipate  stressful situations. Create a plan, and allow extra time to work with your plan.  Practice stress management or self-calming techniques that you have learned from your therapist or your health care provider. General instructions  Take over-the-counter and prescription medicines only as told by your health care provider.  Understand that you are likely to have setbacks. Accept this and be kind to yourself as you persist to take better care of yourself.  Recognize and accept your accomplishments, even if you judge them as small.  Keep all follow-up visits as told by your health care provider. This is important. Contact a health care provider if:  Your symptoms do not get better.  Your symptoms get worse.  You have signs of depression, such as: ? A persistently sad or irritable mood. ? Loss of enjoyment in activities that used to bring you joy. ? Change in weight or eating. ? Changes in sleeping habits. ? Avoiding friends or family members. ? Loss of energy for normal tasks. ? Feelings of guilt or worthlessness. Get help right away if:  You have serious thoughts about hurting yourself or others. If you ever feel like you may hurt yourself or others, or have thoughts about taking your own life, get help right away. Go to your nearest emergency department or:  Call your local emergency services (911 in the [place]  Call a suicide crisis helpline, such as the [facility] at 367-560-7823. This is open 24 hours a day in the [place]  Text the Crisis Text Line at (231) 546-1504 (in the [place].). Summary  Generalized anxiety disorder (GAD) is a mental health condition that involves worry that is not triggered by a specific event.  People with GAD often worry excessively about many things in their lives, such as their health and family.  GAD may cause symptoms such as restlessness, trouble concentrating, sleep problems, frequent sweating,  nausea, diarrhea, headaches, and  trembling or muscle twitching.  A mental health specialist can help determine which treatment is best for you. Some people see improvement with one type of therapy. However, other people require a combination of therapies. This information is not intended to replace advice given to you by your health care provider. Make sure you discuss any questions you have with your health care provider. Document Revised: 01/18/2019 Document Reviewed: 01/18/2019 Elsevier Patient Education  2021 Maharishi [place], MD [facility]

## 2020-09-26 DIAGNOSIS — Z23 Encounter for immunization: Secondary | ICD-10-CM | POA: Diagnosis not present

## 2020-09-26 DIAGNOSIS — S91332A Puncture wound without foreign body, left foot, initial encounter: Secondary | ICD-10-CM | POA: Diagnosis not present

## 2020-10-02 ENCOUNTER — Ambulatory Visit: Payer: Self-pay | Admitting: *Deleted

## 2020-10-02 ENCOUNTER — Encounter: Payer: Self-pay | Admitting: Emergency Medicine

## 2020-10-02 NOTE — Telephone Encounter (Signed)
Patient is calling to report she has tested + COVID with home test. Patient symptoms started Monday: body aches, chills, headache. Patient did test- but it can back negative- she tested again today as she has developed more symptoms- congestion, cough, diarrhea. Positive test today. Patient states she did have COVID in January. Patient advised per COVID protocol- advised to make virtual appointment if she has worsening symptoms.Patient advised to test other family members in home- husband has tested + COVID- daughter is negative at this time- advised to test again in couple day or if she develops symptoms and isolate away from negative family members.

## 2020-10-02 NOTE — Telephone Encounter (Signed)
Reason for Disposition  [1] COVID-19 diagnosed by positive lab test (e.g., PCR, rapid self-test kit) AND [2] mild symptoms (e.g., cough, fever, others) AND [4] no complications or SOB  Answer Assessment - Initial Assessment Questions 1. COVID-19 DIAGNOSIS: "Who made your COVID-19 diagnosis?" "Was it confirmed by a positive lab test or self-test?" If not diagnosed by a doctor (or NP/PA), ask "Are there lots of cases (community spread) where you live?" Note: See public health department website, if unsure.     + COVID home test 2. COVID-19 EXPOSURE: "Was there any known exposure to COVID before the symptoms began?" CDC Definition of close contact: within 6 feet (2 meters) for a total of 15 minutes or more over a 24-hour period.      unknown 3. ONSET: "When did the COVID-19 symptoms start?"      Monday 4. WORST SYMPTOM: "What is your worst symptom?" (e.g., cough, fever, shortness of breath, muscle aches)     Chills/body ache/headache 5. COUGH: "Do you have a cough?" If Yes, ask: "How bad is the cough?"       Yes- phelm- clear 6. FEVER: "Do you have a fever?" If Yes, ask: "What is your temperature, how was it measured, and when did it start?"     100.1- today, since Monday 7. RESPIRATORY STATUS: "Describe your breathing?" (e.g., shortness of breath, wheezing, unable to speak)      Fine now 8. BETTER-SAME-WORSE: "Are you getting better, staying the same or getting worse compared to yesterday?"  If getting worse, ask, "In what way?"     Worse- increased symptoms 9. HIGH RISK DISEASE: "Do you have any chronic medical problems?" (e.g., asthma, heart or lung disease, weak immune system, obesity, etc.)     no 10. VACCINE: "Have you had the COVID-19 vaccine?" If Yes, ask: "Which one, how many shots, when did you get it?"       yes 11. BOOSTER: "Have you received your COVID-19 booster?" If Yes, ask: "Which one and when did you get it?"       no 12. PREGNANCY: "Is there any chance you are pregnant?"  "When was your last menstrual period?"       no 13. OTHER SYMPTOMS: "Do you have any other symptoms?"  (e.g., chills, fatigue, headache, loss of smell or taste, muscle pain, sore throat)       Headache, fatigue, loss taste/smell, muscle pain 14. O2 SATURATION MONITOR:  "Do you use an oxygen saturation monitor (pulse oximeter) at home?" If Yes, ask "What is your reading (oxygen level) today?" "What is your usual oxygen saturation reading?" (e.g., 95%)     no  Protocols used: Coronavirus (COVID-19) Diagnosed or Suspected-A-AH

## 2020-10-03 ENCOUNTER — Other Ambulatory Visit: Payer: Self-pay | Admitting: Obstetrics and Gynecology

## 2020-10-03 DIAGNOSIS — Z1231 Encounter for screening mammogram for malignant neoplasm of breast: Secondary | ICD-10-CM

## 2020-10-04 ENCOUNTER — Telehealth: Payer: 59 | Admitting: Emergency Medicine

## 2020-10-04 DIAGNOSIS — U071 COVID-19: Secondary | ICD-10-CM

## 2020-10-04 NOTE — Progress Notes (Signed)
Based on the information that you have shared in the e-Visit Questionnaire, we recommend that you convert this visit to a video visit in order for the provider to better assess what is going on.  The provider will be able to give you a more accurate diagnosis and treatment plan if we can more freely discuss your symptoms and with the addition of a virtual examination.   If you convert to a video visit, we will bill your insurance (similar to an office visit) and you will not be charged for this e-Visit. You will be able to stay at home and speak with the first available [facility] advanced practice provider. The link to do a video visit is in the drop down Menu tab of your Welcome screen in MyChart.  Approximately 5 minutes was spent documenting and reviewing patient's chart.  

## 2020-11-04 ENCOUNTER — Telehealth: Payer: Self-pay | Admitting: Emergency Medicine

## 2020-11-04 DIAGNOSIS — F411 Generalized anxiety disorder: Secondary | ICD-10-CM

## 2020-11-04 DIAGNOSIS — F410 Panic disorder [episodic paroxysmal anxiety] without agoraphobia: Secondary | ICD-10-CM

## 2020-11-04 NOTE — Telephone Encounter (Signed)
Patient stated that she was unable to see the Psychiatric office. Requesting a new referral so she can have an evaluation done for medication refills.

## 2020-11-05 NOTE — Telephone Encounter (Signed)
  Follow up message  Please return call to patient 

## 2020-11-05 NOTE — Telephone Encounter (Signed)
Pt states she did not address the referral timely & now those doctors do not practice psychiatry anymore; is requesting a new referral b/c she is having trouble finding a doctor on her own (which is causing more anxiety).  Will put in a new referral based on last OV on 09/18/20 with PCP & previous referral information.  Pt also states she will send updated insurance card via mychart.

## 2020-11-05 NOTE — Telephone Encounter (Signed)
Upon investigation of chart, psychiatry referral was placed by PCP on 07/03/20.  Per referral not on 07/22/20:   Spoke to patient on 3/24 sent paperwork needed to schedule. Called to schedule on 3/28 3/30 and 4/4 with no response to schedule.     LVM instructions for pt to return call to get clarification on needs at this time.

## 2020-11-05 NOTE — Addendum Note (Signed)
Addended by: Elza Rafter D on: 11/05/2020 12:13 PM   Modules accepted: Orders

## 2020-11-06 DIAGNOSIS — Z124 Encounter for screening for malignant neoplasm of cervix: Secondary | ICD-10-CM | POA: Diagnosis not present

## 2020-11-06 DIAGNOSIS — Z6828 Body mass index (BMI) 28.0-28.9, adult: Secondary | ICD-10-CM | POA: Diagnosis not present

## 2020-11-06 DIAGNOSIS — Z01419 Encounter for gynecological examination (general) (routine) without abnormal findings: Secondary | ICD-10-CM | POA: Diagnosis not present

## 2020-11-08 ENCOUNTER — Other Ambulatory Visit: Payer: Self-pay | Admitting: Obstetrics and Gynecology

## 2020-11-08 DIAGNOSIS — N644 Mastodynia: Secondary | ICD-10-CM

## 2020-11-29 ENCOUNTER — Telehealth: Payer: Self-pay

## 2020-11-29 ENCOUNTER — Telehealth: Payer: Self-pay | Admitting: Emergency Medicine

## 2020-11-29 ENCOUNTER — Ambulatory Visit (INDEPENDENT_AMBULATORY_CARE_PROVIDER_SITE_OTHER): Payer: BC Managed Care – PPO | Admitting: Psychologist

## 2020-11-29 DIAGNOSIS — F330 Major depressive disorder, recurrent, mild: Secondary | ICD-10-CM

## 2020-11-29 NOTE — Telephone Encounter (Signed)
pt has stated she had a VV with [place] this morning and was told by the Dr. Michail Sermon that he does not do physic evals and that the pt was not asking him the right questions a/w rolling his eyes cause he did not do what the pt was referred for.  **Pt is asking that Dr. Mitchel Honour please help her as she is getting low on her mediation Buspirone 7.'5mg'$  and is in need of a rx refill.  **Pt wants to know how she got about getting the psychic eval done as her PCP stated she is in need of it?

## 2020-11-29 NOTE — Telephone Encounter (Signed)
Patient said she had VV today w/ Dr. Michail Sermon who she was referred to by Dr. Mitchel Honour  Patient says she was told this would be for a psychiatric evaluation  She says while doing visit dr told her that he could not perform psych evaluation bc he was not a psychatrist   Patient says she now has to wait for another referral from Dr. Mitchel Honour to get this matter handled  Please fu w/ patient [phone]

## 2020-12-04 ENCOUNTER — Other Ambulatory Visit: Payer: Self-pay | Admitting: Emergency Medicine

## 2020-12-04 MED ORDER — BUSPIRONE HCL 7.5 MG PO TABS: 7.5000 mg | ORAL_TABLET | Freq: Two times a day (BID) | ORAL | 1 refills | Status: DC

## 2020-12-04 NOTE — Telephone Encounter (Signed)
New prescription for buspirone sent to pharmacy of record. Needs psych/psychology evaluation with someone else. Thanks.

## 2020-12-04 NOTE — Telephone Encounter (Signed)
Called and left a VM to call back to discuss concerns further.

## 2020-12-06 NOTE — Telephone Encounter (Signed)
Called and spoke with pt regarding her medication refill.

## 2020-12-24 ENCOUNTER — Ambulatory Visit
Admission: RE | Admit: 2020-12-24 | Discharge: 2020-12-24 | Disposition: A | Discharge status: Home / Self Care | Payer: BC Managed Care – PPO | Source: Ambulatory Visit | Attending: Obstetrics and Gynecology | Admitting: Obstetrics and Gynecology

## 2020-12-24 ENCOUNTER — Other Ambulatory Visit: Payer: Self-pay

## 2020-12-24 DIAGNOSIS — N644 Mastodynia: Secondary | ICD-10-CM | POA: Diagnosis not present

## 2020-12-24 DIAGNOSIS — R922 Inconclusive mammogram: Secondary | ICD-10-CM | POA: Diagnosis not present

## 2021-01-01 DIAGNOSIS — F411 Generalized anxiety disorder: Secondary | ICD-10-CM | POA: Diagnosis not present

## 2021-01-01 DIAGNOSIS — F331 Major depressive disorder, recurrent, moderate: Secondary | ICD-10-CM | POA: Diagnosis not present

## 2021-01-01 DIAGNOSIS — Z79891 Long term (current) use of opiate analgesic: Secondary | ICD-10-CM | POA: Diagnosis not present

## 2021-01-14 ENCOUNTER — Encounter: Payer: Self-pay | Admitting: Emergency Medicine

## 2021-01-14 ENCOUNTER — Other Ambulatory Visit: Payer: Self-pay

## 2021-01-14 ENCOUNTER — Ambulatory Visit (INDEPENDENT_AMBULATORY_CARE_PROVIDER_SITE_OTHER): Payer: BC Managed Care – PPO | Admitting: Emergency Medicine

## 2021-01-14 VITALS — BP 124/82 | HR 68 | Temp 98.3°F | Ht 73.0 in | Wt 205.0 lb

## 2021-01-14 DIAGNOSIS — M5412 Radiculopathy, cervical region: Secondary | ICD-10-CM

## 2021-01-14 DIAGNOSIS — I10 Essential (primary) hypertension: Secondary | ICD-10-CM

## 2021-01-14 DIAGNOSIS — F411 Generalized anxiety disorder: Secondary | ICD-10-CM | POA: Diagnosis not present

## 2021-01-14 MED ORDER — PREDNISONE 20 MG PO TABS
20.0000 mg | ORAL_TABLET | Freq: Every day | ORAL | 0 refills | Status: AC
Start: 2021-01-14 — End: 2021-01-19

## 2021-01-14 NOTE — Addendum Note (Signed)
Addended by: Durwin Nora on: 01/14/2021 01:46 PM   Modules accepted: Orders

## 2021-01-14 NOTE — Patient Instructions (Signed)
Cervical Radiculopathy  Cervical radiculopathy means that a nerve in the neck (a cervical nerve) is pinched or bruised. This can happen because of an injury to the cervical spine (vertebrae) in the neck, or as a normal part of getting older. This can cause pain or loss of feeling (numbness) that runs from your neck all the way down to your arm and fingers. Often, this condition gets better with rest. Treatment may be needed if the conditiondoes not get better. What are the causes? A neck injury. A bulging disk in your spine. Muscle movements that you cannot control (muscle spasms). Tight muscles in your neck due to overuse. Arthritis. Breakdown in the bones and joints of the spine (spondylosis) due to getting older. Bone spurs that form near the nerves in the neck. What are the signs or symptoms? Pain. The pain may: Run from the neck to the arm and hand. Be very bad or irritating. Be worse when you move your neck. Loss of feeling or tingling in your arm or hand. Weakness in your arm or hand, in very bad cases. How is this treated? In many cases, treatment is not needed for this condition. With rest, the condition often gets better over time. If treatment is needed, options may include: Wearing a soft neck collar (cervical collar) for short periods of time, as told by your doctor. Doing exercises (physical therapy) to strengthen your neck muscles. Taking medicines. Having shots (injections) in your spine, in very bad cases. Having surgery. This may be needed if other treatments do not help. The type of surgery that is used depends on the cause of your condition. Follow these instructions at home: If you have a soft neck collar: Wear it as told by your doctor. Remove it only as told by your doctor. Ask your doctor if you can remove the collar for cleaning and bathing. If you are allowed to remove the collar for cleaning or bathing: Follow instructions from your doctor about how to remove  the collar safely. Clean the collar by wiping it with mild soap and water and drying it completely. Take out any removable pads in the collar every 1-2 days. Wash them by hand with soap and water. Let them air-dry completely before you put them back in the collar. Check your skin under the collar for redness or sores. If you see any, tell your doctor. Managing pain     Take over-the-counter and prescription medicines only as told by your doctor. If told, put ice on the painful area. If you have a soft neck collar, remove it as told by your doctor. Put ice in a plastic bag. Place a towel between your skin and the bag. Leave the ice on for 20 minutes, 2-3 times a day. If using ice does not help, you can try using heat. Use the heat source that your doctor recommends, such as a moist heat pack or a heating pad. Place a towel between your skin and the heat source. Leave the heat on for 20-30 minutes. Remove the heat if your skin turns bright red. This is very important if you are unable to feel pain, heat, or cold. You may have a greater risk of getting burned. You may try a gentle neck and shoulder rub (massage). Activity Rest as needed. Return to your normal activities as told by your doctor. Ask your doctor what activities are safe for you. Do exercises as told by your doctor or physical therapist. Do not lift anything that   is heavier than 10 lb (4.5 kg) until your doctor tells you that it is safe. General instructions Use a flat pillow when you sleep. Do not drive while wearing a soft neck collar. If you do not have a soft neck collar, ask your doctor if it is safe to drive while your neck heals. Ask your doctor if the medicine prescribed to you requires you to avoid driving or using heavy machinery. Do not use any products that contain nicotine or tobacco, such as cigarettes, e-cigarettes, and chewing tobacco. These can delay healing. If you need help quitting, ask your doctor. Keep all  follow-up visits as told by your doctor. This is important. Contact a doctor if: Your condition does not get better with treatment. Get help right away if: Your pain gets worse and is not helped with medicine. You lose feeling or feel weak in your hand, arm, face, or leg. You have a high fever. You have a stiff neck. You cannot control when you poop or pee (have incontinence). You have trouble with walking, balance, or talking. Summary Cervical radiculopathy means that a nerve in the neck is pinched or bruised. A nerve can get pinched from a bulging disk, arthritis, an injury to the neck, or other causes. Symptoms include pain, tingling, or loss of feeling that goes from the neck into the arm or hand. Weakness in your arm or hand can happen in very bad cases. Treatment may include resting, wearing a soft neck collar, and doing exercises. You might need to take medicines for pain. In very bad cases, shots or surgery may be needed. This information is not intended to replace advice given to you by your health care provider. Make sure you discuss any questions you have with your healthcare provider. Document Revised: 02/18/2018 Document Reviewed: 02/18/2018 Elsevier Patient Education  2022 [employer]  

## 2021-01-14 NOTE — Assessment & Plan Note (Signed)
Much improved with Prozac 20 mg daily and BuSpar 7.5 mg twice a day.

## 2021-01-14 NOTE — Assessment & Plan Note (Signed)
Well-controlled hypertension on no medications.

## 2021-01-14 NOTE — Progress Notes (Signed)
Stacy Bray 40 y.o.   Chief Complaint  Patient presents with   Numbness    Right arm and hand x 2 months    HISTORY OF PRESENT ILLNESS: This is a 40 y.o. female complaining of right arm numbness and tingling for the past 2 months.  Denies injury. Denies neck pain or any other associated symptoms.  At times right arm feels weaker than the left. Last visit with me on 09/18/2020 for generalized anxiety disorder.  Was started on BuSpar 7.5 mg twice a day.  Was able to follow-up with psychiatrist who started her on Prozac 20 mg daily.  Medications working well for her.  Has follow-up appointment later this month. Also had mammogram done 12/24/2020 which showed no evidence of breast malignancy and benign cysts in the upper outer right breast No other complaints today or medical concerns.  HPI   Prior to Admission medications   Medication Sig Start Date End Date Taking? Authorizing Provider  busPIRone (BUSPAR) 7.5 MG tablet Take 1 tablet (7.5 mg total) by mouth 2 (two) times daily. 12/04/20 03/04/21 Yes Kamaree Wheatley, Ines Bloomer, MD  FLUoxetine (PROZAC) 20 MG capsule Take 20 mg by mouth every morning. 01/01/21  Yes [provider]  levonorgestrel (MIRENA, 52 MG,) 20 MCG/DAY IUD Mirena 20 mcg/24 hours (8 yrs) 52 mg intrauterine device  Take 1 device by intrauterine route.   Yes [provider]    Allergies  Allergen Reactions   Penicillins Hives and Other (See Comments)    Per patient she was young    Patient Active Problem List   Diagnosis Date Noted   Generalized anxiety disorder 07/03/2020   Panic attacks 07/03/2020   Essential hypertension 07/03/2020   Moderate episode of recurrent major depressive disorder ([place]) 07/03/2020    Past Medical History:  Diagnosis Date   Anxiety    Phreesia 01/28/2020   Depression    Phreesia 01/28/2020   Hypertension     Past Surgical History:  Procedure Laterality Date   INTRAUTERINE DEVICE INSERTION  2009   LITHOTRIPSY   2000   kidney stones    Social History   Socioeconomic History   Marital status: Married    Spouse name: Not on file   Number of children: Not on file   Years of education: Not on file   Highest education level: Not on file  Occupational History   Not on file  Tobacco Use   Smoking status: Former    Packs/day: 1.00    Years: 20.00    Pack years: 20.00    Types: Cigarettes    Quit date: 05/01/2011    Years since quitting: 9.7   Smokeless tobacco: Never  Vaping Use   Vaping Use: Never used  Substance and Sexual Activity   Alcohol use: Yes    Alcohol/week: 7.0 standard drinks    Types: 7 Glasses of wine per week   Drug use: No   Sexual activity: Yes    Birth control/protection: I.U.D.  Other Topics Concern   Not on file  Social History Narrative   Not on file   Social Determinants of Health   Financial Resource Strain: Not on file  Food Insecurity: Not on file  Transportation Needs: Not on file  Physical Activity: Not on file  Stress: Not on file  Social Connections: Not on file  Intimate Partner Violence: Not on file    Family History  Problem Relation Age of Onset   Cancer Mother  colon cancer, ovarian cancer   Cancer Maternal Aunt        melanoma, bladder cancer   Cancer Maternal Grandmother        Breast Cancer   Colon cancer Maternal Grandmother    Cancer Maternal Grandfather        colon cancer   Breast cancer Paternal Aunt    Breast cancer Paternal Grandmother      Review of Systems  Constitutional: Negative.  Negative for chills and fever.  HENT: Negative.  Negative for congestion and sore throat.   Respiratory: Negative.  Negative for cough and shortness of breath.   Cardiovascular:  Negative for chest pain and palpitations.  Gastrointestinal:  Negative for abdominal pain, nausea and vomiting.  Genitourinary: Negative.  Negative for dysuria and hematuria.  Musculoskeletal:  Negative for back pain and joint pain.  Skin: Negative.   Negative for rash.  Neurological:  Positive for sensory change (Right arm). Negative for dizziness and headaches.  All other systems reviewed and are negative. Today's Vitals   01/14/21 1036  BP: 124/82  Pulse: 68  Temp: 98.3 F (36.8 C)  TempSrc: Oral  SpO2: 96%  Weight: 205 lb (93 kg)  Height: 6\' 1"  (1.854 m)   Body mass index is 27.05 kg/m.   Physical Exam Vitals reviewed.  Constitutional:      Appearance: Normal appearance.  HENT:     Head: Normocephalic.  Eyes:     Extraocular Movements: Extraocular movements intact.     Pupils: Pupils are equal, round, and reactive to light.  Cardiovascular:     Rate and Rhythm: Normal rate.  Pulmonary:     Effort: Pulmonary effort is normal.  Musculoskeletal:     Cervical back: Normal range of motion. No tenderness.  Lymphadenopathy:     Cervical: No cervical adenopathy.  Skin:    General: Skin is warm and dry.  Neurological:     General: No focal deficit present.     Mental Status: She is alert and oriented to person, place, and time.  Psychiatric:        Mood and Affect: Mood normal.        Behavior: Behavior normal.     ASSESSMENT & PLAN: Problem List Items Addressed This Visit       Cardiovascular and Mediastinum   Essential hypertension    Well-controlled hypertension on no medications.        Nervous and Auditory   Cervical radiculopathy - Primary    Cervical radiculopathy symptoms may be related to cervical spine disease. Will need orthopedic evaluation and MRI of cervical spine. Will try course of prednisone 20 mg daily for 5 days and see if there is improvement of symptoms.       Relevant Medications   FLUoxetine (PROZAC) 20 MG capsule   predniSONE (DELTASONE) 20 MG tablet   Other Relevant Orders   MR Cervical Spine Wo Contrast   Ambulatory referral to Orthopedic Surgery     Other   Generalized anxiety disorder    Much improved with Prozac 20 mg daily and BuSpar 7.5 mg twice a day.       Relevant Medications   FLUoxetine (PROZAC) 20 MG capsule   Patient Instructions  Cervical Radiculopathy Cervical radiculopathy means that a nerve in the neck (a cervical nerve) is pinched or bruised. This can happen because of an injury to the cervical spine (vertebrae) in the neck, or as a normal part of getting older. This can cause pain or  loss of feeling (numbness) that runs from your neck all the way down to your arm and fingers. Often, this condition gets better with rest. Treatment may be needed if the condition does not get better. What are the causes? A neck injury. A bulging disk in your spine. Muscle movements that you cannot control (muscle spasms). Tight muscles in your neck due to overuse. Arthritis. Breakdown in the bones and joints of the spine (spondylosis) due to getting older. Bone spurs that form near the nerves in the neck. What are the signs or symptoms? Pain. The pain may: Run from the neck to the arm and hand. Be very bad or irritating. Be worse when you move your neck. Loss of feeling or tingling in your arm or hand. Weakness in your arm or hand, in very bad cases. How is this treated? In many cases, treatment is not needed for this condition. With rest, the condition often gets better over time. If treatment is needed, options may include: Wearing a soft neck collar (cervical collar) for short periods of time, as told by your doctor. Doing exercises (physical therapy) to strengthen your neck muscles. Taking medicines. Having shots (injections) in your spine, in very bad cases. Having surgery. This may be needed if other treatments do not help. The type of surgery that is used depends on the cause of your condition. Follow these instructions at home: If you have a soft neck collar: Wear it as told by your doctor. Remove it only as told by your doctor. Ask your doctor if you can remove the collar for cleaning and bathing. If you are allowed to remove the collar  for cleaning or bathing: Follow instructions from your doctor about how to remove the collar safely. Clean the collar by wiping it with mild soap and water and drying it completely. Take out any removable pads in the collar every 1-2 days. Wash them by hand with soap and water. Let them air-dry completely before you put them back in the collar. Check your skin under the collar for redness or sores. If you see any, tell your doctor. Managing pain   Take over-the-counter and prescription medicines only as told by your doctor. If told, put ice on the painful area. If you have a soft neck collar, remove it as told by your doctor. Put ice in a plastic bag. Place a towel between your skin and the bag. Leave the ice on for 20 minutes, 2-3 times a day. If using ice does not help, you can try using heat. Use the heat source that your doctor recommends, such as a moist heat pack or a heating pad. Place a towel between your skin and the heat source. Leave the heat on for 20-30 minutes. Remove the heat if your skin turns bright red. This is very important if you are unable to feel pain, heat, or cold. You may have a greater risk of getting burned. You may try a gentle neck and shoulder rub (massage). Activity Rest as needed. Return to your normal activities as told by your doctor. Ask your doctor what activities are safe for you. Do exercises as told by your doctor or physical therapist. Do not lift anything that is heavier than 10 lb (4.5 kg) until your doctor tells you that it is safe. General instructions Use a flat pillow when you sleep. Do not drive while wearing a soft neck collar. If you do not have a soft neck collar, ask your doctor if it  is safe to drive while your neck heals. Ask your doctor if the medicine prescribed to you requires you to avoid driving or using heavy machinery. Do not use any products that contain nicotine or tobacco, such as cigarettes, e-cigarettes, and chewing tobacco.  These can delay healing. If you need help quitting, ask your doctor. Keep all follow-up visits as told by your doctor. This is important. Contact a doctor if: Your condition does not get better with treatment. Get help right away if: Your pain gets worse and is not helped with medicine. You lose feeling or feel weak in your hand, arm, face, or leg. You have a high fever. You have a stiff neck. You cannot control when you poop or pee (have incontinence). You have trouble with walking, balance, or talking. Summary Cervical radiculopathy means that a nerve in the neck is pinched or bruised. A nerve can get pinched from a bulging disk, arthritis, an injury to the neck, or other causes. Symptoms include pain, tingling, or loss of feeling that goes from the neck into the arm or hand. Weakness in your arm or hand can happen in very bad cases. Treatment may include resting, wearing a soft neck collar, and doing exercises. You might need to take medicines for pain. In very bad cases, shots or surgery may be needed. This information is not intended to replace advice given to you by your health care provider. Make sure you discuss any questions you have with your health care provider. Document Revised: 02/18/2018 Document Reviewed: 02/18/2018 Elsevier Patient Education  2022 [place], MD [facility]

## 2021-01-14 NOTE — Assessment & Plan Note (Signed)
Cervical radiculopathy symptoms may be related to cervical spine disease. Will need orthopedic evaluation and MRI of cervical spine. Will try course of prednisone 20 mg daily for 5 days and see if there is improvement of symptoms.

## 2021-01-20 DIAGNOSIS — M25511 Pain in right shoulder: Secondary | ICD-10-CM | POA: Diagnosis not present

## 2021-01-20 DIAGNOSIS — M542 Cervicalgia: Secondary | ICD-10-CM | POA: Diagnosis not present

## 2021-01-27 ENCOUNTER — Ambulatory Visit
Admission: RE | Admit: 2021-01-27 | Discharge: 2021-01-27 | Disposition: A | Discharge status: Home / Self Care | Payer: BC Managed Care – PPO | Source: Ambulatory Visit | Attending: Emergency Medicine | Admitting: Emergency Medicine

## 2021-01-27 ENCOUNTER — Other Ambulatory Visit: Payer: Self-pay

## 2021-01-27 DIAGNOSIS — M5412 Radiculopathy, cervical region: Secondary | ICD-10-CM

## 2021-01-27 DIAGNOSIS — M4692 Unspecified inflammatory spondylopathy, cervical region: Secondary | ICD-10-CM | POA: Diagnosis not present

## 2021-01-27 DIAGNOSIS — M4722 Other spondylosis with radiculopathy, cervical region: Secondary | ICD-10-CM | POA: Diagnosis not present

## 2021-01-27 DIAGNOSIS — M50123 Cervical disc disorder at C6-C7 level with radiculopathy: Secondary | ICD-10-CM | POA: Diagnosis not present

## 2021-01-30 DIAGNOSIS — M542 Cervicalgia: Secondary | ICD-10-CM | POA: Diagnosis not present

## 2021-02-04 DIAGNOSIS — F331 Major depressive disorder, recurrent, moderate: Secondary | ICD-10-CM | POA: Diagnosis not present

## 2021-02-04 DIAGNOSIS — F411 Generalized anxiety disorder: Secondary | ICD-10-CM | POA: Diagnosis not present

## 2021-02-18 DIAGNOSIS — M542 Cervicalgia: Secondary | ICD-10-CM | POA: Diagnosis not present

## 2021-02-18 DIAGNOSIS — M5412 Radiculopathy, cervical region: Secondary | ICD-10-CM | POA: Diagnosis not present

## 2021-04-09 ENCOUNTER — Telehealth: Payer: BC Managed Care – PPO | Admitting: Physician Assistant

## 2021-04-09 DIAGNOSIS — J069 Acute upper respiratory infection, unspecified: Secondary | ICD-10-CM

## 2021-04-09 MED ORDER — ALBUTEROL SULFATE HFA 108 (90 BASE) MCG/ACT IN AERS: 2.0000 | INHALATION_SPRAY | RESPIRATORY_TRACT | 0 refills | Status: DC | PRN

## 2021-04-09 MED ORDER — FLUTICASONE PROPIONATE 50 MCG/ACT NA SUSP: 2.0000 | Freq: Every day | NASAL | 0 refills | Status: DC

## 2021-04-09 MED ORDER — AEROCHAMBER PLUS FLO-VU MEDIUM MISC: 1.0000 | Freq: Once | 0 refills | Status: AC

## 2021-04-09 MED ORDER — PROMETHAZINE-DM 6.25-15 MG/5ML PO SYRP: 5.0000 mL | ORAL_SOLUTION | Freq: Four times a day (QID) | ORAL | 0 refills | Status: DC | PRN

## 2021-04-09 NOTE — Progress Notes (Signed)
[name], Stacy Bray for a virtual visit with your provider today.    Just as we do with appointments in the office, we must obtain your consent to participate.  Your consent will be active for this visit and any virtual visit you may have with one of our providers in the next 365 days.    If you have a MyChart account, I can also send a copy of this consent to you electronically.  All virtual visits are billed to your insurance company just like a traditional visit in the office.  As this is a virtual visit, video technology does not allow for your provider to perform a traditional examination.  This may limit your provider's ability to fully assess your condition.  If your provider identifies any concerns that need to be evaluated in person or the need to arrange testing such as labs, EKG, etc, we will make arrangements to do so.    Although advances in technology are sophisticated, we cannot ensure that it will always work on either your end or our end.  If the connection with a video visit is poor, we may have to switch to a telephone visit.  With either a video or telephone visit, we are not always able to ensure that we have a secure connection.   I need to obtain your verbal consent now.   Are you willing to proceed with your visit today?   Stacy Bray has provided verbal consent on 04/09/2021 for a virtual visit (video or telephone).   [name], PA-C 04/09/2021  4:52 PM   Date:  04/09/2021   ID:  [name], DOB 1980/09/26, MRN [id number]  Patient Location: Home Provider Location: Home Office   Participants: Patient and Provider for Visit and Wrap up  Method of visit: Video  Location of Patient: Home Location of Provider: Home Office Consent was obtain for visit over the video. Services rendered by provider: Visit was performed via video  A video enabled telemedicine application was used and I verified that I am speaking with the correct person  using two identifiers.  PCP:  Horald Pollen, MD   Chief Complaint:  URI  History of Present Illness:    Stacy Bray is a 40 y.o. female with history as stated below. Presents video telehealth for an acute care visit.   Pt reports feeling lethargic since Monday (2 days).  Pt reports last night she developed sore throat, cough,   Pt reports taking Nyquil last night to sleep.  Temp at home has been 99.5.   Pt reports this morning she awoke with nasal congestion, headache.  Pt reports COVID test x3 over the last 3 days.  Pt has been gargling with salt water which improved her sore throat but she awoke often times coughing.   No other aggravating or relieving factors.  No other c/o.  Past Medical, Surgical, Social History, Allergies, and Medications have been Reviewed.  Patient Active Problem List   Diagnosis Date Noted   Cervical radiculopathy 01/14/2021   Generalized anxiety disorder 07/03/2020   Panic attacks 07/03/2020   Essential hypertension 07/03/2020   Moderate episode of recurrent major depressive disorder ([place]) 07/03/2020    Social History   Tobacco Use   Smoking status: Former    Packs/day: 1.00    Years: 20.00    Pack years: 20.00    Types: Cigarettes    Quit date: 05/01/2011    Years since quitting: 9.9   Smokeless  tobacco: Never  Substance Use Topics   Alcohol use: Yes    Alcohol/week: 7.0 standard drinks    Types: 7 Glasses of wine per week     Current Outpatient Medications:    albuterol (VENTOLIN HFA) 108 (90 Base) MCG/ACT inhaler, Inhale 2 puffs into the lungs every 2 (two) hours as needed for wheezing or shortness of breath (cough)., Disp: 8 g, Rfl: 0   fluticasone (FLONASE) 50 MCG/ACT nasal spray, Place 2 sprays into both nostrils daily., Disp: 9.9 g, Rfl: 0   promethazine-dextromethorphan (PROMETHAZINE-DM) 6.25-15 MG/5ML syrup, Take 5 mLs by mouth 4 (four) times daily as needed for cough., Disp: 118 mL, Rfl: 0   Spacer/Aero-Holding  Chambers (AEROCHAMBER PLUS FLO-VU MEDIUM) MISC, 1 each by Other route once for 1 dose., Disp: 1 each, Rfl: 0   FLUoxetine (PROZAC) 20 MG capsule, Take 20 mg by mouth every morning., Disp: , Rfl:    levonorgestrel (MIRENA, 52 MG,) 20 MCG/DAY IUD, Mirena 20 mcg/24 hours (8 yrs) 52 mg intrauterine device  Take 1 device by intrauterine route., Disp: , Rfl:    Allergies  Allergen Reactions   Penicillins Hives and Other (See Comments)    Per patient she was young     Review of Systems  Constitutional:  Positive for chills, fever and malaise/fatigue.  HENT:  Positive for congestion and sore throat. Negative for ear pain.   Eyes:  Negative for blurred vision and double vision.  Respiratory:  Positive for cough. Negative for shortness of breath and wheezing.   Cardiovascular:  Negative for chest pain, palpitations and leg swelling.  Gastrointestinal:  Negative for abdominal pain, diarrhea, nausea and vomiting.  Genitourinary:  Negative for dysuria.  Musculoskeletal:  Positive for myalgias.  Skin:  Negative for rash.  Neurological:  Positive for headaches. Negative for loss of consciousness and weakness.  Psychiatric/Behavioral:  The patient is not nervous/anxious.   See HPI for history of present illness.  Physical Exam Constitutional:      General: She is not in acute distress.    Appearance: Normal appearance. She is not ill-appearing.  HENT:     Head: Normocephalic and atraumatic.     Nose: Congestion present.  Eyes:     Extraocular Movements: Extraocular movements intact.  Pulmonary:     Effort: Pulmonary effort is normal.     Comments: Speaks in full sentences Musculoskeletal:        General: Normal range of motion.     Cervical back: Normal range of motion.  Skin:    Coloration: Skin is not pale.  Neurological:     General: No focal deficit present.     Mental Status: She is alert. Mental status is at baseline.  Psychiatric:        Mood and Affect: Mood normal.               A&P  1. Viral upper respiratory tract infection - suspect influenza and recommend flu test  - symptomatic therapies promethazine dextromethorphan, Flonase, albuterol, Mucinex  -In person visit for worsening symptoms including worsening fevers, difficulty breathing or other concerns.   Patient voiced understanding and agreement to plan.   Time:   Today, I have spent 15 minutes with the patient with telehealth technology discussing the above problems, reviewing the chart, previous notes, medications and orders.    Tests Ordered: No orders of the defined types were placed in this encounter.   Medication Changes: Meds ordered this encounter  Medications   fluticasone (FLONASE)  50 MCG/ACT nasal spray    Sig: Place 2 sprays into both nostrils daily.    Dispense:  9.9 g    Refill:  0   promethazine-dextromethorphan (PROMETHAZINE-DM) 6.25-15 MG/5ML syrup    Sig: Take 5 mLs by mouth 4 (four) times daily as needed for cough.    Dispense:  118 mL    Refill:  0   albuterol (VENTOLIN HFA) 108 (90 Base) MCG/ACT inhaler    Sig: Inhale 2 puffs into the lungs every 2 (two) hours as needed for wheezing or shortness of breath (cough).    Dispense:  8 g    Refill:  0   Spacer/Aero-Holding Chambers (AEROCHAMBER PLUS FLO-VU MEDIUM) MISC    Sig: 1 each by Other route once for 1 dose.    Dispense:  1 each    Refill:  0     Disposition:  Follow up urgent care or [facility] for influenza test  SignedAbigail Butts, PA-C  04/09/2021 5:01 PM

## 2021-04-09 NOTE — Patient Instructions (Signed)
1. Viral upper respiratory tract infection - suspect influenza and recommend flu test  - symptomatic therapies promethazine dextromethorphan, Flonase, albuterol, Mucinex  -In person visit for worsening symptoms including worsening fevers, difficulty breathing or other concerns.

## 2021-04-10 DIAGNOSIS — Z9189 Other specified personal risk factors, not elsewhere classified: Secondary | ICD-10-CM | POA: Diagnosis not present

## 2021-04-10 DIAGNOSIS — J0111 Acute recurrent frontal sinusitis: Secondary | ICD-10-CM | POA: Diagnosis not present

## 2021-04-10 DIAGNOSIS — Z1152 Encounter for screening for COVID-19: Secondary | ICD-10-CM | POA: Diagnosis not present

## 2021-04-16 ENCOUNTER — Encounter (HOSPITAL_COMMUNITY): Payer: Self-pay | Admitting: Emergency Medicine

## 2021-04-16 ENCOUNTER — Emergency Department (HOSPITAL_COMMUNITY)
Admission: EM | Admit: 2021-04-16 | Discharge: 2021-04-17 | Disposition: A | Discharge status: Home / Self Care | Payer: BC Managed Care – PPO | Attending: Emergency Medicine | Admitting: Emergency Medicine

## 2021-04-16 DIAGNOSIS — F419 Anxiety disorder, unspecified: Secondary | ICD-10-CM | POA: Diagnosis not present

## 2021-04-16 DIAGNOSIS — R202 Paresthesia of skin: Secondary | ICD-10-CM | POA: Diagnosis not present

## 2021-04-16 DIAGNOSIS — R55 Syncope and collapse: Secondary | ICD-10-CM | POA: Insufficient documentation

## 2021-04-16 DIAGNOSIS — F410 Panic disorder [episodic paroxysmal anxiety] without agoraphobia: Secondary | ICD-10-CM | POA: Insufficient documentation

## 2021-04-16 DIAGNOSIS — R064 Hyperventilation: Secondary | ICD-10-CM | POA: Diagnosis not present

## 2021-04-16 DIAGNOSIS — Z5321 Procedure and treatment not carried out due to patient leaving prior to being seen by health care provider: Secondary | ICD-10-CM | POA: Diagnosis not present

## 2021-04-16 DIAGNOSIS — R404 Transient alteration of awareness: Secondary | ICD-10-CM | POA: Diagnosis not present

## 2021-04-16 DIAGNOSIS — R457 State of emotional shock and stress, unspecified: Secondary | ICD-10-CM | POA: Diagnosis not present

## 2021-04-16 DIAGNOSIS — R0689 Other abnormalities of breathing: Secondary | ICD-10-CM | POA: Diagnosis not present

## 2021-04-16 LAB — CBC
HCT: 44.4 % (ref 36.0–46.0)
Hemoglobin: 15.5 g/dL — ABNORMAL HIGH (ref 12.0–15.0)
MCH: 34.1 pg — ABNORMAL HIGH (ref 26.0–34.0)
MCHC: 34.9 g/dL (ref 30.0–36.0)
MCV: 97.8 fL (ref 80.0–100.0)
Platelets: 201 10*3/uL (ref 150–400)
RBC: 4.54 MIL/uL (ref 3.87–5.11)
RDW: 11.9 % (ref 11.5–15.5)
WBC: 14 10*3/uL — ABNORMAL HIGH (ref 4.0–10.5)
nRBC: 0 % (ref 0.0–0.2)

## 2021-04-16 LAB — BASIC METABOLIC PANEL
Anion gap: 13 (ref 5–15)
BUN: 7 mg/dL (ref 6–20)
CO2: 26 mmol/L (ref 22–32)
Calcium: 9.2 mg/dL (ref 8.9–10.3)
Chloride: 100 mmol/L (ref 98–111)
Creatinine, Ser: 0.77 mg/dL (ref 0.44–1.00)
GFR, Estimated: >60 mL/min (ref >60)
Glucose, Bld: 109 mg/dL — ABNORMAL HIGH (ref 70–99)
Potassium: 3.4 mmol/L — ABNORMAL LOW (ref 3.5–5.1)
Sodium: 139 mmol/L (ref 135–145)

## 2021-04-16 LAB — I-STAT BETA HCG BLOOD, ED (MC, WL, AP ONLY): I-stat hCG, quantitative: <5 m[IU]/mL (ref <5)

## 2021-04-16 LAB — CBG MONITORING, ED: Glucose-Capillary: 150 mg/dL — ABNORMAL HIGH (ref 70–99)

## 2021-04-16 NOTE — ED Notes (Signed)
Patient is not answering any triage questions at this time-shaking all over-hyperventilating-will continue to monitor

## 2021-04-16 NOTE — ED Triage Notes (Signed)
Per EMS-states patient was at [employer] said she "passed out"-patient was on floor when EMS arrived-states symptoms resemble a panic attack-patient hyperventilating and having tingling in hands and feet-

## 2021-04-16 NOTE — ED Provider Triage Note (Signed)
Emergency Medicine Provider Triage Evaluation Note  Stacy Bray , a 41 y.o. female  was evaluated in triage.  Pt complains of syncope. States that same occurred when she was at work today. She states that she was taking an order from a customer and all of a sudden she got hot and diaphoretic and passed out. She states she now feels shaky all over and feels like she is having a panic attack. Hx of same. She denies any pain, no injuries from her syncopal episode.  Review of Systems  Positive: Syncope Negative: Fever, chills  Physical Exam  BP (!) 147/93 (BP Location: Right Arm)    Pulse 92    Temp 98.4 F (36.9 C) (Oral)    Resp 16    SpO2 100%  Gen:   Awake, no distress   Resp:  Normal effort  MSK:   Moves extremities without difficulty  Other:    Medical Decision Making  Medically screening exam initiated at 10:43 AM.  Appropriate orders placed.  Stacy Bray was informed that the remainder of the evaluation will be completed by another provider, this initial triage assessment does not replace that evaluation, and the importance of remaining in the ED until their evaluation is complete.     Bud Face, PA-C 04/16/21 1045

## 2021-06-12 ENCOUNTER — Other Ambulatory Visit: Payer: Self-pay | Admitting: Emergency Medicine

## 2021-07-31 ENCOUNTER — Encounter: Payer: Self-pay | Admitting: Emergency Medicine

## 2021-07-31 ENCOUNTER — Ambulatory Visit (INDEPENDENT_AMBULATORY_CARE_PROVIDER_SITE_OTHER): Payer: BC Managed Care – PPO | Admitting: Emergency Medicine

## 2021-07-31 VITALS — BP 126/74 | HR 94 | Temp 98.6°F | Ht 73.0 in | Wt 211.0 lb

## 2021-07-31 DIAGNOSIS — M7918 Myalgia, other site: Secondary | ICD-10-CM

## 2021-07-31 DIAGNOSIS — S46912A Strain of unspecified muscle, fascia and tendon at shoulder and upper arm level, left arm, initial encounter: Secondary | ICD-10-CM | POA: Diagnosis not present

## 2021-07-31 MED ORDER — CYCLOBENZAPRINE HCL 10 MG PO TABS: 10.0000 mg | ORAL_TABLET | Freq: Every day | ORAL | 0 refills | Status: DC

## 2021-07-31 MED ORDER — TRAMADOL HCL 50 MG PO TABS: 50.0000 mg | ORAL_TABLET | Freq: Three times a day (TID) | ORAL | 0 refills | Status: AC | PRN

## 2021-07-31 NOTE — Patient Instructions (Signed)
Muscle Strain A muscle strain, or pulled muscle, happens when a muscle is stretched beyond its normal length. This can tear some muscle fibers and cause pain. Usually, it takes 1-2 weeks to heal from a muscle strain. Full healing normally takes 5-6 weeks. What are the causes? This condition is caused when a sudden force is placed on a muscle and stretches it too far. This can happen with a fall, while lifting, or during sports. What increases the risk? You are more likely to develop a muscle strain if you are an athlete or you do a lot of physical activity. What are the signs or symptoms? Pain. Tenderness. Bruising. Swelling. Trouble using the muscle. How is this treated? This condition is first treated with PRICE therapy. This involves: Protecting your muscle from being injured again. Resting your injured muscle. Icing your injured muscle. Putting pressure (compression) on your injured muscle. This may be done with a splint or elastic bandage. Raising (elevating) your injured muscle. Your doctor may also recommend medicine for pain. Follow these instructions at home: If you have a splint that can be taken off: Wear the splint as told by your doctor. Take it off only as told by your doctor. Check the skin around the splint every day. Tell your doctor if you see problems. Loosen the splint if your fingers or toes: Tingle. Become numb. Turn cold and blue. Keep the splint clean. If the splint is not waterproof: Do not let it get wet. Cover it with a watertight covering when you take a bath or a shower. Managing pain, stiffness, and swelling  If told, put ice on your injured area. To do this: If you have a removable splint, take it off as told by your doctor. Put ice in a plastic bag. Place a towel between your skin and the bag. Leave the ice on for 20 minutes, 2-3 times a day. Take off the ice if your skin turns bright red. This is very important. If you cannot feel pain, heat,  or cold, you have a greater risk of damage to the area. Move your fingers or toes often. Raise the injured area above the level of your heart while you are sitting or lying down. Wear an elastic bandage as told by your doctor. Make sure it is not too tight. General instructions Take over-the-counter and prescription medicines only as told by your doctor. This may include: Medicines for pain and swelling that are taken by mouth or put on the skin. Medicines to help relax your muscles. Limit your activity. Rest your injured muscle as told by your doctor. Your doctor may say that gentle movements are okay. If physical therapy was prescribed, do exercises as told by your doctor. Do not put pressure on any part of the splint until it is fully hardened. This may take many hours. Do not smoke or use any products that contain nicotine or tobacco. If you need help quitting, ask your doctor. Ask your doctor when it is safe to drive if you have a splint. Keep all follow-up visits. How is this prevented? Warm up before you exercise. This helps to prevent more muscle strains. Contact a doctor if: You have more pain or swelling in the injured area. Get help right away if: You have any of these problems in your injured area: Numbness. Tingling. Less strength than normal. Summary A muscle strain is an injury that happens when a muscle is stretched beyond normal length. This condition is first treated with PRICE   therapy. This includes protecting, resting, icing, adding pressure, and raising your injury. Limit your activity. Rest your injured muscle as told by your doctor. Your doctor may say that gentle movements are okay. Warm up before you exercise. This helps to prevent more muscle strains. This information is not intended to replace advice given to you by your health care provider. Make sure you discuss any questions you have with your health care provider. Document Revised: 06/17/2020 Document  Reviewed: 06/17/2020 Elsevier Patient Education  2023 [employer] Inc.  

## 2021-07-31 NOTE — Progress Notes (Signed)
Stacy Bray ?41 y.o. ? ? ?Chief Complaint  ?Patient presents with  ? left shoulder blade pain  ?  Sharp pain since 4/8, pain shoots down to left arm  ? ? ?HISTORY OF PRESENT ILLNESS: ?Acute problem visit today. ?This is a 41 y.o. female tripped and fell down the Friday before Easter.  Injured left shoulder.  Complaining of sharp pain to left scapular area radiating to left arm since.  No other injuries.  No other associated symptoms. ?No other complaints or medical concerns. ?Cervical spine MRI done last October showed mild degenerative changes.  No significant spinal stenosis. ? ?HPI ? ? ?Prior to Admission medications   ?Medication Sig Start Date End Date Taking? Authorizing Provider  ?levonorgestrel (MIRENA, 52 MG,) 20 MCG/DAY IUD Mirena 20 mcg/24 hours (8 yrs) 52 mg intrauterine device ? Take 1 device by intrauterine route.   Yes [provider]  ?busPIRone (BUSPAR) 7.5 MG tablet TAKE 1 TABLET BY MOUTH 2 TIMES DAILY. ?Patient not taking: Reported on 07/31/2021 06/12/21   Horald Pollen, MD  ? ? ?Allergies  ?Allergen Reactions  ? Penicillins Hives and Other (See Comments)  ?  Per patient she was young  ? ? ?Patient Active Problem List  ? Diagnosis Date Noted  ? Cervical radiculopathy 01/14/2021  ? Generalized anxiety disorder 07/03/2020  ? Panic attacks 07/03/2020  ? Essential hypertension 07/03/2020  ? Moderate episode of recurrent major depressive disorder ([place]) 07/03/2020  ? ? ?Past Medical History:  ?Diagnosis Date  ? Anxiety   ? [employer]rees[employer] 01/28/2020  ? Depression   ? [employer] 01/28/2020  ? Hypertension   ? ? ?Past Surgical History:  ?Procedure Laterality Date  ? INTRAUTERINE DEVICE INSERTION  2009  ? LITHOTRIPSY  2000  ? kidney stones  ? ? ?Social History  ? ?Socioeconomic History  ? Marital status: Married  ?  Spouse name: Not on file  ? Number of children: Not on file  ? Years of education: Not on file  ? Highest education level: Not on file  ?Occupational History  ? Not on file   ?Tobacco Use  ? Smoking status: Former  ?  Packs/day: 1.00  ?  Years: 20.00  ?  Pack years: 20.00  ?  Types: Cigarettes  ?  Quit date: 05/01/2011  ?  Years since quitting: 10.2  ? Smokeless tobacco: Never  ?Vaping Use  ? Vaping Use: Never used  ?Substance and Sexual Activity  ? Alcohol use: Yes  ?  Alcohol/week: 7.0 standard drinks  ?  Types: 7 Glasses of wine per week  ? Drug use: No  ? Sexual activity: Yes  ?  Birth control/protection: I.U.D.  ?Other Topics Concern  ? Not on file  ?Social History Narrative  ? Not on file  ? ?Social Determinants of Health  ? ?Financial Resource Strain: Not on file  ?Food Insecurity: Not on file  ?Transportation Needs: Not on file  ?Physical Activity: Not on file  ?Stress: Not on file  ?Social Connections: Not on file  ?Intimate Partner Violence: Not on file  ? ? ?Family History  ?Problem Relation Age of Onset  ? Cancer Mother   ?     colon cancer, ovarian cancer  ? Cancer Maternal Aunt   ?     melanoma, bladder cancer  ? Cancer Maternal Grandmother   ?     Breast Cancer  ? Colon cancer Maternal Grandmother   ? Cancer Maternal Grandfather   ?  colon cancer  ? Breast cancer Paternal Aunt   ? Breast cancer Paternal Grandmother   ? ? ? ?Review of Systems  ?Constitutional: Negative.  Negative for chills and fever.  ?HENT: Negative.    ?Eyes: Negative.   ?Respiratory: Negative.    ?Cardiovascular: Negative.   ?Gastrointestinal:  Negative for abdominal pain, nausea and vomiting.  ?Genitourinary: Negative.   ?Musculoskeletal:  Positive for back pain.  ?Skin: Negative.  Negative for rash.  ?Neurological: Negative.  Negative for dizziness and headaches.  ?All other systems reviewed and are negative. ? ?Today's Vitals  ? 07/31/21 1413  ?BP: 126/74  ?Pulse: 94  ?Temp: 98.6 ?F (37 ?C)  ?TempSrc: Oral  ?SpO2: 92%  ?Weight: 211 lb (95.7 kg)  ?Height: '6\' 1"'$  (1.854 m)  ? ?Body mass index is 27.84 kg/m?. ? ?Physical Exam ?Vitals reviewed.  ?Constitutional:   ?   Appearance: Normal appearance.   ?HENT:  ?   Head: Normocephalic.  ?Eyes:  ?   Extraocular Movements: Extraocular movements intact.  ?   Conjunctiva/sclera: Conjunctivae normal.  ?   Pupils: Pupils are equal, round, and reactive to light.  ?Cardiovascular:  ?   Rate and Rhythm: Normal rate and regular rhythm.  ?   Pulses: Normal pulses.  ?   Heart sounds: Normal heart sounds.  ?Pulmonary:  ?   Effort: Pulmonary effort is normal.  ?   Breath sounds: Normal breath sounds.  ?Musculoskeletal:  ?   Cervical back: No tenderness.  ?Lymphadenopathy:  ?   Cervical: No cervical adenopathy.  ?Skin: ?   General: Skin is warm and dry.  ?Neurological:  ?   General: No focal deficit present.  ?   Mental Status: She is alert and oriented to person, place, and time.  ?Psychiatric:     ?   Mood and Affect: Mood normal.     ?   Behavior: Behavior normal.  ? ? ? ?ASSESSMENT & PLAN: ?Clinically stable.  No red flag signs or symptoms. ?Musculoskeletal pain with muscle spasm. ?We will treat with muscle relaxant and analgesic as needed. ?No other medical concerns identified during this visit. ?Advised to contact the office if no better or worse during the next several days. ?A total of 30 minutes was spent with the patient and counseling/coordination of care regarding preparing for this visit, review of most recent office visit notes, differential diagnosis of left scapular pain, diagnosis of muscle strain and spasm and treatment, review of all medications, review of new medications prescribed, prognosis, documentation and need for follow-up. ? ?Problem List Items Addressed This Visit   ?None ?Visit Diagnoses   ? ? Muscle strain of left scapular region, initial encounter    -  Primary  ? Musculoskeletal pain      ? Relevant Medications  ? cyclobenzaprine (FLEXERIL) 10 MG tablet  ? traMADol (ULTRAM) 50 MG tablet  ? ?  ? ?Patient Instructions  ?Muscle Strain ?A muscle strain, or pulled muscle, happens when a muscle is stretched beyond its normal length. This can tear some  muscle fibers and cause pain. ?Usually, it takes 1-2 weeks to heal from a muscle strain. Full healing normally takes 5-6 weeks. ?What are the causes? ?This condition is caused when a sudden force is placed on a muscle and stretches it too far. This can happen with a fall, while lifting, or during sports. ?What increases the risk? ?You are more likely to develop a muscle strain if you are an athlete or you do a  lot of physical activity. ?What are the signs or symptoms? ?Pain. ?Tenderness. ?Bruising. ?Swelling. ?Trouble using the muscle. ?How is this treated? ?This condition is first treated with PRICE therapy. This involves: ?Protecting your muscle from being injured again. ?Resting your injured muscle. ?Icing your injured muscle. ?Putting pressure (compression) on your injured muscle. This may be done with a splint or elastic bandage. ?Raising (elevating) your injured muscle. ?Your doctor may also recommend medicine for pain. ?Follow these instructions at home: ?If you have a splint that can be taken off: ?Wear the splint as told by your doctor. Take it off only as told by your doctor. ?Check the skin around the splint every day. Tell your doctor if you see problems. ?Loosen the splint if your fingers or toes: ?Tingle. ?Become numb. ?Turn cold and blue. ?Keep the splint clean. ?If the splint is not waterproof: ?Do not let it get wet. ?Cover it with a watertight covering when you take a bath or a shower. ?Managing pain, stiffness, and swelling ? ?If told, put ice on your injured area. To do this: ?If you have a removable splint, take it off as told by your doctor. ?Put ice in a plastic bag. ?Place a towel between your skin and the bag. ?Leave the ice on for 20 minutes, 2-3 times a day. ?Take off the ice if your skin turns bright red. This is very important. If you cannot feel pain, heat, or cold, you have a greater risk of damage to the area. ?Move your fingers or toes often. ?Raise the injured area above the level  of your heart while you are sitting or lying down. ?Wear an elastic bandage as told by your doctor. Make sure it is not too tight. ?General instructions ?Take over-the-counter and prescription medicines only as t

## 2021-10-22 ENCOUNTER — Encounter: Payer: Self-pay | Admitting: Family Medicine

## 2021-10-22 ENCOUNTER — Ambulatory Visit (INDEPENDENT_AMBULATORY_CARE_PROVIDER_SITE_OTHER): Payer: BC Managed Care – PPO | Admitting: Family Medicine

## 2021-10-22 VITALS — BP 138/84 | HR 101 | Temp 97.3°F | Ht 71.0 in | Wt 203.1 lb

## 2021-10-22 DIAGNOSIS — R051 Acute cough: Secondary | ICD-10-CM

## 2021-10-22 DIAGNOSIS — H6981 Other specified disorders of Eustachian tube, right ear: Secondary | ICD-10-CM | POA: Diagnosis not present

## 2021-10-22 DIAGNOSIS — J0140 Acute pansinusitis, unspecified: Secondary | ICD-10-CM

## 2021-10-22 MED ORDER — PREDNISONE 10 MG (21) PO TBPK: ORAL_TABLET | Freq: Every day | ORAL | 0 refills | Status: DC

## 2021-10-22 MED ORDER — AZITHROMYCIN 250 MG PO TABS: ORAL_TABLET | ORAL | 0 refills | Status: DC

## 2021-10-22 MED ORDER — BENZONATATE 200 MG PO CAPS: 200.0000 mg | ORAL_CAPSULE | Freq: Two times a day (BID) | ORAL | 0 refills | Status: DC | PRN

## 2021-10-22 NOTE — Progress Notes (Signed)
Subjective:  Stacy Bray is a 41 y.o. female who presents for possible sinus infection.  Symptoms include bilateral ear fullness pain, rhinorrhea, nasal congestion, post nasal drainage and cough. Chest congestion also noted. States she does not have asthma but had an albuterol inhaler at home from previous illness and she has used it.   States she had N/V/D 2 days ago for only one day and has resolved.   5 negative Covid tests at home.   Denies fever, chest pain, palpitations, shortness of breath, abdominal pain.   Past history is significant for nothing. Patient is a former smoker, quit 5 years ago.  Using Sudafed, Flonase, albuterol inhaler and OTC sinus medications for symptoms.  Denies sick contacts.  No other aggravating or relieving factors.  No other c/o.  ROS as in subjective   Objective: Vitals:   10/22/21 1039  BP: 138/84  Pulse: (!) 101  Temp: (!) 97.3 F (36.3 C)  SpO2: 94%    General appearance: Alert, WD/WN, no distress                             Skin: warm, no rash                           Head: + sinus tenderness,                            Eyes: conjunctiva normal, corneas clear, PERRLA                            Ears: right TM is dull and erythematous, pearly left TM, external ear canals normal                          Nose: septum midline, turbinates swollen, with erythema and thick discharge             Mouth/throat: MMM, tongue normal, mild pharyngeal erythema                           Neck: supple, no adenopathy, no thyromegaly, nontender                          Heart: RRR, normal S1, S2, no murmurs                         Lungs: CTA bilaterally, no wheezes, rales, or rhonchi       Assessment and Plan: Acute non-recurrent pansinusitis - Plan: azithromycin (ZITHROMAX Z-PAK) 250 MG tablet, predniSONE (STERAPRED UNI-PAK 21 TAB) 10 MG (21) TBPK tablet  Eustachian tube dysfunction, right - Plan: predniSONE (STERAPRED UNI-PAK 21 TAB) 10 MG (21) TBPK  tablet  Acute cough - Plan: benzonatate (TESSALON) 200 MG capsule   Discussed antibiotic options for sinusitis and she has allergies to penicillin and cannot take cephalosporins.  She is going out of town and will be in the sun so we opted to not choose doxycycline.  Z-Pak prescribed.  I also prescribed prednisone Dosepak and Tessalon Perles.  Can use OTC Mucinex, Sudafed, Flonase for congestion.  Tylenol or Ibuprofen OTC for fever and malaise.  Follow-up if worsening or not back to baseline when she completes the  antibiotic and steroids.

## 2021-10-22 NOTE — Patient Instructions (Addendum)
Take the antibiotic and oral steroids as prescribed.  Take the steroids with a full glass of water and a meal.  Be aware that oral steroids can cause flushing and insomnia so take these as early in the day as possible.  I also prescribed a medication called Tessalon Perles for cough.  These are not sedating and you can take these in addition to over-the-counter medications.  You may continue over-the-counter medications as well such as Tylenol, ibuprofen, Mucinex, Sudafed and an antihistamine such as Claritin, Zyrtec, Allegra or Xyzal.  I also recommend continuing with a steroid nasal spray such as Flonase or Nasacort.  Follow-up if you are getting worse or if you are not improving over the next few days.    Sinus Infection, Adult A sinus infection, also called sinusitis, is inflammation of your sinuses. Sinuses are hollow spaces in the bones around your face. Your sinuses are located: Around your eyes. In the middle of your forehead. Behind your nose. In your cheekbones. Mucus normally drains out of your sinuses. When your nasal tissues become inflamed or swollen, mucus can become trapped or blocked. This allows bacteria, viruses, and fungi to grow, which leads to infection. Most infections of the sinuses are caused by a virus. A sinus infection can develop quickly. It can last for up to 4 weeks (acute) or for more than 12 weeks (chronic). A sinus infection often develops after a cold. What are the causes? This condition is caused by anything that creates swelling in the sinuses or stops mucus from draining. This includes: Allergies. Asthma. Infection from bacteria or viruses. Deformities or blockages in your nose or sinuses. Abnormal growths in the nose (nasal polyps). Pollutants, such as chemicals or irritants in the air. Infection from fungi. This is rare. What increases the risk? You are more likely to develop this condition if you: Have a weak body defense system (immune  system). Do a lot of swimming or diving. Overuse nasal sprays. Smoke. What are the signs or symptoms? The main symptoms of this condition are pain and a feeling of pressure around the affected sinuses. Other symptoms include: Stuffy nose or congestion that makes it difficult to breathe through your nose. Thick yellow or greenish drainage from your nose. Tenderness, swelling, and warmth over the affected sinuses. A cough that may get worse at night. Decreased sense of smell and taste. Extra mucus that collects in the throat or the back of the nose (postnasal drip) causing a sore throat or bad breath. Tiredness (fatigue). Fever. How is this diagnosed? This condition is diagnosed based on: Your symptoms. Your medical history. A physical exam. Tests to find out if your condition is acute or chronic. This may include: Checking your nose for nasal polyps. Viewing your sinuses using a device that has a light (endoscope). Testing for allergies or bacteria. Imaging tests, such as an MRI or CT scan. In rare cases, a bone biopsy may be done to rule out more serious types of fungal sinus disease. How is this treated? Treatment for a sinus infection depends on the cause and whether your condition is chronic or acute. If caused by a virus, your symptoms should go away on their own within 10 days. You may be given medicines to relieve symptoms. They include: Medicines that shrink swollen nasal passages (decongestants). A spray that eases inflammation of the nostrils (topical intranasal corticosteroids). Rinses that help get rid of thick mucus in your nose (nasal saline washes). Medicines that treat allergies (antihistamines). Over-the-counter pain  relievers. If caused by bacteria, your health care provider may recommend waiting to see if your symptoms improve. Most bacterial infections will get better without antibiotic medicine. You may be given antibiotics if you have: A severe infection. A  weak immune system. If caused by narrow nasal passages or nasal polyps, surgery may be needed. Follow these instructions at home: Medicines Take, use, or apply over-the-counter and prescription medicines only as told by your health care provider. These may include nasal sprays. If you were prescribed an antibiotic medicine, take it as told by your health care provider. Do not stop taking the antibiotic even if you start to feel better. Hydrate and humidify  Drink enough fluid to keep your urine pale yellow. Staying hydrated will help to thin your mucus. Use a cool mist humidifier to keep the humidity level in your home above 50%. Inhale steam for 10-15 minutes, 3-4 times a day, or as told by your health care provider. You can do this in the bathroom while a hot shower is running. Limit your exposure to cool or dry air. Rest Rest as much as possible. Sleep with your head raised (elevated). Make sure you get enough sleep each night. General instructions  Apply a warm, moist washcloth to your face 3-4 times a day or as told by your health care provider. This will help with discomfort. Use nasal saline washes as often as told by your health care provider. Wash your hands often with soap and water to reduce your exposure to germs. If soap and water are not available, use hand sanitizer. Do not smoke. Avoid being around people who are smoking (secondhand smoke). Keep all follow-up visits. This is important. Contact a health care provider if: You have a fever. Your symptoms get worse. Your symptoms do not improve within 10 days. Get help right away if: You have a severe headache. You have persistent vomiting. You have severe pain or swelling around your face or eyes. You have vision problems. You develop confusion. Your neck is stiff. You have trouble breathing. These symptoms may be an emergency. Get help right away. Call 911. Do not wait to see if the symptoms will go away. Do not  drive yourself to the hospital. Summary A sinus infection is soreness and inflammation of your sinuses. Sinuses are hollow spaces in the bones around your face. This condition is caused by nasal tissues that become inflamed or swollen. The swelling traps or blocks the flow of mucus. This allows bacteria, viruses, and fungi to grow, which leads to infection. If you were prescribed an antibiotic medicine, take it as told by your health care provider. Do not stop taking the antibiotic even if you start to feel better. Keep all follow-up visits. This is important. This information is not intended to replace advice given to you by your health care provider. Make sure you discuss any questions you have with your health care provider. Document Revised: 03/04/2021 Document Reviewed: 03/04/2021 Elsevier Patient Education  [employer].

## 2021-10-26 ENCOUNTER — Other Ambulatory Visit: Payer: Self-pay | Admitting: Emergency Medicine

## 2021-10-26 DIAGNOSIS — M7918 Myalgia, other site: Secondary | ICD-10-CM

## 2021-10-30 ENCOUNTER — Telehealth: Payer: Self-pay | Admitting: Emergency Medicine

## 2021-10-30 NOTE — Telephone Encounter (Signed)
Pt had OV with Vickie on 7/12 for a possible sinus infection with symptoms including bilateral ear fullness pain, rhinorrhea, nasal congestion, post nasal drainage and cough. She was prescribed an antibiotic and oral steroids which helped while she was taking them but now that they are completed pt reports she is feeling worse. She is now experiencing a sore throat and swollen tongue. She is wondering if there are any OTC medications she can take to help as she cant come in for OV?   Please advise in PCP absence.

## 2021-10-30 NOTE — Telephone Encounter (Signed)
Pt saw Harland Dingwall on 7/12 and was prescribed an antibiotic and oral steroid. She stated her symptoms eased up while taking the medications. She was told to return if her symptoms worsen. She has completed the medications and she stated the symptoms came back and are worse. She would like to know are there any otc medications that can be suggested. She said she has a really sore throat. And she stated her tongue is swollen. First available ov is 11/04/21 and she said she will be back at work then and can't make it to the office.    Please advise.  CB: [phone]

## 2021-10-31 ENCOUNTER — Encounter: Payer: Self-pay | Admitting: Nurse Practitioner

## 2021-10-31 ENCOUNTER — Telehealth (INDEPENDENT_AMBULATORY_CARE_PROVIDER_SITE_OTHER): Payer: BC Managed Care – PPO | Admitting: Nurse Practitioner

## 2021-10-31 DIAGNOSIS — T783XXA Angioneurotic edema, initial encounter: Secondary | ICD-10-CM | POA: Insufficient documentation

## 2021-10-31 NOTE — Progress Notes (Signed)
   Established Patient Office Visit  An audio/visual tele-health visit was completed today for this patient. I connected with  Stacy Bray on 10/31/21 utilizing audio/visual technology and verified that I am speaking with the correct person using two identifiers. The patient was located at their home, and I was located at the office of [place] at [facility] during the encounter. I discussed the limitations of evaluation and management by telemedicine. The patient expressed understanding and agreed to proceed.     Subjective   Patient ID: Stacy Bray, female    DOB: 05/02/1980  Age: 41 y.o. MRN: [id number]  Chief Complaint  Patient presents with   Facial Swelling   Patient was seen approximately 10 days ago for evaluation of sinusitis.  She was treated with azithromycin and prednisone taper pack.  She reports that her sinusitis symptoms have significantly improved.  She still has slight soreness to her throat.  She also reports that the day after completing her azithromycin and prednisone she started to experience facial swelling with swelling of bilateral eyes, swelling of her tongue, and swelling of her lips.  She denies any shortness of breath or wheezing.  She reports that today swelling has improved with use of antihistamine every 4 hours.  Her main concern is her throat soreness.     ROS: See HPI    Objective:     There were no vitals taken for this visit.    Physical Exam Comprehensive physical exam not completed today as office visit was conducted remotely.  Patient appears well over video, no significant facial swelling noted.  Patient is able to speak in complete sentences without having to stop to breathe.  Patient was alert and oriented, and appeared to have appropriate judgment.   No results found for any visits on 10/31/21.    The 10-year ASCVD risk score (Arnett DK, et al., 2019) is: 0.4%    Assessment & Plan:   Problem List Items  Addressed This Visit       Other   Angio-edema - Primary    Acute, symptoms suggestive of angioedema secondary to azithromycin.  I believe her taking prednisone while taking the azithromycin helped to mask the symptoms until the prednisone was completed.  For now I recommend she avoid taking azithromycin again in the future and monitor herself for progressive symptoms or persistent symptoms.  She was encouraged to continue taking her over-the-counter generic Claritin every 4 hours as recommended per label instructions, and consider adding daily famotidine.  No current signs of swelling or respiratory distress so we will avoid additional prednisone prescription today.  Did recommend that if she experiences rebound swelling of lips tongue or throat that she call 911, she reports her understanding.  She was told if when she returns from vacation if her throat soreness persists she should call this office for in person evaluation.  She reports her understanding.       Return if symptoms worsen or fail to improve.    Ailene Ards, NP

## 2021-10-31 NOTE — Telephone Encounter (Signed)
Swelling of tongue could be an emergency and needs visit with any [place] office today

## 2021-10-31 NOTE — Telephone Encounter (Signed)
VV scheduled for today to discuss with Jeralyn Ruths, NP

## 2021-10-31 NOTE — Assessment & Plan Note (Signed)
Acute, symptoms suggestive of angioedema secondary to azithromycin.  I believe her taking prednisone while taking the azithromycin helped to mask the symptoms until the prednisone was completed.  For now I recommend she avoid taking azithromycin again in the future and monitor herself for progressive symptoms or persistent symptoms.  She was encouraged to continue taking her over-the-counter generic Claritin every 4 hours as recommended per label instructions, and consider adding daily famotidine.  No current signs of swelling or respiratory distress so we will avoid additional prednisone prescription today.  Did recommend that if she experiences rebound swelling of lips tongue or throat that she call 911, she reports her understanding.  She was told if when she returns from vacation if her throat soreness persists she should call this office for in person evaluation.  She reports her understanding.

## 2021-12-11 ENCOUNTER — Telehealth (INDEPENDENT_AMBULATORY_CARE_PROVIDER_SITE_OTHER): Payer: BC Managed Care – PPO | Admitting: Nurse Practitioner

## 2021-12-11 ENCOUNTER — Encounter: Payer: Self-pay | Admitting: Nurse Practitioner

## 2021-12-11 VITALS — HR 99

## 2021-12-11 DIAGNOSIS — K529 Noninfective gastroenteritis and colitis, unspecified: Secondary | ICD-10-CM | POA: Insufficient documentation

## 2021-12-11 MED ORDER — ONDANSETRON HCL 4 MG PO TABS: 4.0000 mg | ORAL_TABLET | Freq: Three times a day (TID) | ORAL | 0 refills | Status: DC | PRN

## 2021-12-11 NOTE — Assessment & Plan Note (Signed)
Acute, treat symptomatically. Focus on hydration follow brat diet.  Take Zofran 4 mg by mouth every 8 hours as needed for nausea or vomiting.  Encouraged use over-the-counter Imodium as needed for diarrhea.  Told patient if symptoms worsen or she is unable to keep water or food down she needs to proceed to the emergency department.  If symptoms persist into early next week she needs to be seen for in person evaluation, patient reports understanding.  She is encouraged to also be seen in person next week if ear pain persists.  She reports her understanding.  Patient also educated and warned about signs of serotonin syndrome and if these were to occur she knows to take no additional doses of Zofran and proceed to the emergency department.

## 2021-12-11 NOTE — Progress Notes (Signed)
   Established Patient Office Visit  An audio/visual tele-health visit was completed today for this patient. I connected with  Stacy Bray on 12/11/21 utilizing audio/visual technology and verified that I am speaking with the correct person using two identifiers. The patient was located at their home, and I was located at the office of [place] at [facility] during the encounter. I discussed the limitations of evaluation and management by telemedicine. The patient expressed understanding and agreed to proceed.    Subjective   Patient ID: Stacy Bray, female    DOB: 29-Dec-1980  Age: 41 y.o. MRN: [id number]  Chief Complaint  Patient presents with   Nausea     Symptoms started 2 days ago.  At first symptoms improved but then this morning she woke up with nausea and vomiting.  She reports she has vomited 8-9 times since 3 AM this morning and has also had multiple episodes of diarrhea.  She also has headache.  She did test for COVID and this was negative.  No known exposure to COVID contact.  She does report at work stomach bug is going around.  She does report that she has been able to keep down water but not much food.  She also reports that she has ear pain which seem to worsen with vomiting she is concerned she may have popped her ear.    Review of Systems  Constitutional:  Positive for diaphoresis and malaise/fatigue. Negative for chills and fever.  Respiratory:  Negative for shortness of breath.   Cardiovascular:  Negative for chest pain.  Gastrointestinal:  Positive for diarrhea, nausea and vomiting. Negative for blood in stool.  Neurological:  Positive for dizziness and headaches.      Objective:     Pulse 99    Physical Exam Comprehensive physical exam not completed today as office visit was conducted remotely.  Patient appears generally well over video.  Patient was alert and oriented, and appeared to have appropriate judgment.   No results found  for any visits on 12/11/21.    The ASCVD Risk score (Arnett DK, et al., 2019) failed to calculate for the following reasons:   The systolic blood pressure is missing    Assessment & Plan:   Problem List Items Addressed This Visit       Digestive   Gastroenteritis - Primary    Acute, treat symptomatically. Focus on hydration follow brat diet.  Take Zofran 4 mg by mouth every 8 hours as needed for nausea or vomiting.  Encouraged use over-the-counter Imodium as needed for diarrhea.  Told patient if symptoms worsen or she is unable to keep water or food down she needs to proceed to the emergency department.  If symptoms persist into early next week she needs to be seen for in person evaluation, patient reports understanding.  She is encouraged to also be seen in person next week if ear pain persists.  She reports her understanding.  Patient also educated and warned about signs of serotonin syndrome and if these were to occur she knows to take no additional doses of Zofran and proceed to the emergency department.      Relevant Medications   ondansetron (ZOFRAN) 4 MG tablet    Return if symptoms worsen or fail to improve.    Ailene Ards, NP

## 2021-12-22 ENCOUNTER — Other Ambulatory Visit: Payer: Self-pay | Admitting: Emergency Medicine

## 2022-02-02 DIAGNOSIS — J029 Acute pharyngitis, unspecified: Secondary | ICD-10-CM | POA: Diagnosis not present

## 2022-02-02 DIAGNOSIS — Z6830 Body mass index (BMI) 30.0-30.9, adult: Secondary | ICD-10-CM | POA: Diagnosis not present

## 2022-02-02 DIAGNOSIS — J020 Streptococcal pharyngitis: Secondary | ICD-10-CM | POA: Diagnosis not present

## 2022-03-02 DIAGNOSIS — Z6828 Body mass index (BMI) 28.0-28.9, adult: Secondary | ICD-10-CM | POA: Diagnosis not present

## 2022-03-02 DIAGNOSIS — K648 Other hemorrhoids: Secondary | ICD-10-CM | POA: Diagnosis not present

## 2022-03-02 DIAGNOSIS — B965 Pseudomonas (aeruginosa) (mallei) (pseudomallei) as the cause of diseases classified elsewhere: Secondary | ICD-10-CM | POA: Diagnosis not present

## 2022-03-10 DIAGNOSIS — Z01419 Encounter for gynecological examination (general) (routine) without abnormal findings: Secondary | ICD-10-CM | POA: Diagnosis not present

## 2022-03-10 DIAGNOSIS — Z1231 Encounter for screening mammogram for malignant neoplasm of breast: Secondary | ICD-10-CM | POA: Diagnosis not present

## 2022-03-10 DIAGNOSIS — Z6828 Body mass index (BMI) 28.0-28.9, adult: Secondary | ICD-10-CM | POA: Diagnosis not present

## 2022-05-13 DIAGNOSIS — Z03818 Encounter for observation for suspected exposure to other biological agents ruled out: Secondary | ICD-10-CM | POA: Diagnosis not present

## 2022-05-13 DIAGNOSIS — R051 Acute cough: Secondary | ICD-10-CM | POA: Diagnosis not present

## 2022-05-13 DIAGNOSIS — Z20822 Contact with and (suspected) exposure to covid-19: Secondary | ICD-10-CM | POA: Diagnosis not present

## 2022-05-13 DIAGNOSIS — Z20828 Contact with and (suspected) exposure to other viral communicable diseases: Secondary | ICD-10-CM | POA: Diagnosis not present

## 2022-05-13 DIAGNOSIS — J0140 Acute pansinusitis, unspecified: Secondary | ICD-10-CM | POA: Diagnosis not present

## 2022-06-16 ENCOUNTER — Other Ambulatory Visit: Payer: Self-pay | Admitting: Emergency Medicine

## 2022-08-31 ENCOUNTER — Ambulatory Visit (INDEPENDENT_AMBULATORY_CARE_PROVIDER_SITE_OTHER): Payer: BC Managed Care – PPO | Admitting: Emergency Medicine

## 2022-08-31 ENCOUNTER — Encounter: Payer: Self-pay | Admitting: Emergency Medicine

## 2022-08-31 VITALS — BP 148/96 | HR 77 | Temp 98.4°F | Ht 71.0 in | Wt 207.4 lb

## 2022-08-31 DIAGNOSIS — R030 Elevated blood-pressure reading, without diagnosis of hypertension: Secondary | ICD-10-CM | POA: Insufficient documentation

## 2022-08-31 DIAGNOSIS — L723 Sebaceous cyst: Secondary | ICD-10-CM

## 2022-08-31 DIAGNOSIS — L089 Local infection of the skin and subcutaneous tissue, unspecified: Secondary | ICD-10-CM | POA: Diagnosis not present

## 2022-08-31 MED ORDER — DOXYCYCLINE HYCLATE 100 MG PO TABS: 100.0000 mg | ORAL_TABLET | Freq: Two times a day (BID) | ORAL | 0 refills | Status: AC

## 2022-08-31 NOTE — Assessment & Plan Note (Signed)
States blood pressure is always elevated when she comes to the doctor's office but normal at home. Advised to continue monitoring blood pressure readings at home daily for the next several weeks and keep a log.  Advised to contact the office if numbers persistently abnormal.

## 2022-08-31 NOTE — Patient Instructions (Signed)
Epidermoid Cyst  An epidermoid cyst, also called an epidermal cyst, is a small lump under your skin. The cyst contains a substance called keratin. Do not try to pop or open the cyst yourself. What are the causes? A blocked hair follicle. A hair that curls and re-enters the skin instead of growing straight out of the skin. A blocked pore. Irritated skin. An injury to the skin. Certain conditions that are passed along from parent to child. Human papillomavirus (HPV). This happens rarely when cysts occur on the bottom of the feet. Long-term sun damage to the skin. What increases the risk? Having acne. Being female. Having an injury to the skin. Being past puberty. Having certain conditions caused by genes (genetic disorder) What are the signs or symptoms? These cysts are usually harmless, but they can get infected. Symptoms of infection may include: Redness. Inflammation. Tenderness. Warmth. Fever. A bad-smelling substance that drains from the cyst. Pus that drains from the cyst. How is this treated? In many cases, epidermoid cysts go away on their own without treatment. If a cyst becomes infected, treatment may include: Opening and draining the cyst, done by a doctor. After draining, you may need minor surgery to remove the rest of the cyst. Antibiotic medicine. Shots of medicines (steroids) that help to reduce inflammation. Surgery to remove the cyst. Surgery may be done if the cyst: Becomes large. Bothers you. Has a chance of turning into cancer. Do not try to open a cyst yourself. Follow these instructions at home: Medicines Take over-the-counter and prescription medicines as told by your doctor. If you were prescribed an antibiotic medicine, take it as told by your doctor. Do not stop taking it even if you start to feel better. General instructions Keep the area around your cyst clean and dry. Wear loose, dry clothing. Avoid touching your cyst. Check your cyst every day  for signs of infection. Check for: Redness, swelling, or pain. Fluid or blood. Warmth. Pus or a bad smell. Keep all follow-up visits. How is this prevented? Wear clean, dry, clothing. Avoid wearing tight clothing. Keep your skin clean and dry. Take showers or baths every day. Contact a doctor if: Your cyst has symptoms of infection. Your condition does not improve or gets worse. You have a cyst that looks different from other cysts you have had. You have a fever. Get help right away if: Redness spreads from the cyst into the area close by. Summary An epidermoid cyst is a small lump under your skin. If a cyst becomes infected, treatment may include surgery to open and drain the cyst, or to remove it. Take over-the-counter and prescription medicines only as told by your doctor. Contact a doctor if your condition is not improving or is getting worse. Keep all follow-up visits. This information is not intended to replace advice given to you by your health care provider. Make sure you discuss any questions you have with your health care provider. Document Revised: 07/05/2019 Document Reviewed: 07/05/2019 Elsevier Patient Education  2023 Elsevier Inc.  

## 2022-08-31 NOTE — Progress Notes (Signed)
Stacy Bray 42 y.o.   Chief Complaint  Patient presents with   Cyst    Patient states she has a knot on her back, patient states it been there for about a year, painful, red,     HISTORY OF PRESENT ILLNESS: [name] complaining of painful chronic cyst to upper mid back  HPI   Prior to Admission medications   Medication Sig Start Date End Date Taking? Authorizing Provider  benzonatate (TESSALON) 200 MG capsule Take 1 capsule (200 mg total) by mouth 2 (two) times daily as needed for cough. 10/22/21  Yes Henson, Vickie L, NP-C  busPIRone (BUSPAR) 7.5 MG tablet TAKE 1 TABLET BY MOUTH TWICE A DAY 06/16/22  Yes Adasyn Mcadams, Eilleen Kempf, MD  doxycycline (VIBRA-TABS) 100 MG tablet Take 1 tablet (100 mg total) by mouth 2 (two) times daily for 7 days. 08/31/22 09/07/22 Yes Gera Inboden, Eilleen Kempf, MD  levonorgestrel (MIRENA, 52 MG,) 20 MCG/DAY IUD Mirena 20 mcg/24 hours (8 yrs) 52 mg intrauterine device  Take 1 device by intrauterine route.   Yes [provider]  levonorgestrel (MIRENA, 52 MG,) 20 MCG/DAY IUD Mirena   Yes [provider]  ondansetron (ZOFRAN) 4 MG tablet Take 1 tablet (4 mg total) by mouth every 8 (eight) hours as needed for nausea or vomiting. 12/11/21  Yes Elenore Paddy, NP    Allergies  Allergen Reactions   Azithromycin Swelling   Penicillins Hives and Other (See Comments)    Per patient she was young    Patient Active Problem List   Diagnosis Date Noted   Gastroenteritis 12/11/2021   Angio-edema 10/31/2021   Cervical radiculopathy 01/14/2021   Generalized anxiety disorder 07/03/2020   Panic attacks 07/03/2020   Essential hypertension 07/03/2020   Moderate episode of recurrent major depressive disorder (HCC) 07/03/2020   Abnormal finding on mammography 10/20/2018   Elevated liver enzymes level due to cystic fibrosis (HCC) 08/02/2018   Obesity with body mass index 30 or greater 09/14/2016    Past Medical History:  Diagnosis Date    Anxiety    Phreesia 01/28/2020   Depression    Phreesia 01/28/2020   Hypertension     Past Surgical History:  Procedure Laterality Date   INTRAUTERINE DEVICE INSERTION  2009   LITHOTRIPSY  2000   kidney stones    Social History   Socioeconomic History   Marital status: Married    Spouse name: Not on file   Number of children: Not on file   Years of education: Not on file   Highest education level: Not on file  Occupational History   Not on file  Tobacco Use   Smoking status: Former    Packs/day: 1.00    Years: 20.00    Additional pack years: 0.00    Total pack years: 20.00    Types: Cigarettes    Quit date: 05/01/2011    Years since quitting: 11.3   Smokeless tobacco: Never  Vaping Use   Vaping Use: Never used  Substance and Sexual Activity   Alcohol use: Yes    Alcohol/week: 7.0 standard drinks of alcohol    Types: 7 Glasses of wine per week   Drug use: No   Sexual activity: Yes    Birth control/protection: I.U.D.  Other Topics Concern   Not on file  Social History Narrative   Not on file   Social Determinants of Health   Financial Resource Strain: Not on file  Food Insecurity: Not  on file  Transportation Needs: Not on file  Physical Activity: Not on file  Stress: Not on file  Social Connections: Not on file  Intimate Partner Violence: Not on file    Family History  Problem Relation Age of Onset   Cancer Mother        colon cancer, ovarian cancer   Cancer Maternal Aunt        melanoma, bladder cancer   Cancer Maternal Grandmother        Breast Cancer   Colon cancer Maternal Grandmother    Cancer Maternal Grandfather        colon cancer   Breast cancer Paternal Aunt    Breast cancer Paternal Grandmother      Review of Systems  Constitutional: Negative.  Negative for chills and fever.  HENT:  Negative for congestion.   Respiratory: Negative.  Negative for cough and shortness of breath.   Cardiovascular:  Negative for chest pain.   Gastrointestinal:  Negative for nausea and vomiting.  Skin: Negative.        Painful cyst to her back  Neurological:  Negative for dizziness and headaches.    Vitals:   08/31/22 1109 08/31/22 1129  BP: (!) 150/100 (!) 148/96  Pulse: 77   Temp: 98.4 F (36.9 C)   SpO2: 98%     Physical Exam Vitals reviewed.  Constitutional:      Appearance: Normal appearance.  HENT:     Head: Normocephalic.  Eyes:     Extraocular Movements: Extraocular movements intact.  Cardiovascular:     Rate and Rhythm: Normal rate.  Pulmonary:     Effort: Pulmonary effort is normal.  Skin:    General: Skin is warm and dry.     Findings: Lesion present.     Comments: Sebaceous cyst to upper mid back with surrounding erythema Hard and tender.  No fluctuation  Neurological:     Mental Status: She is alert and oriented to person, place, and time.  Psychiatric:        Mood and Affect: Mood normal.        Behavior: Behavior normal.      ASSESSMENT & PLAN: A total of 30 minutes was spent with the patient and counseling/coordination of care regarding preparing for this visit, review of most recent office visit notes, review of chronic medical conditions, review of all medications, diagnosis of infected sebaceous cyst and need for oral antibiotics, prognosis, documentation and need for follow-up.  Problem List Items Addressed This Visit       Musculoskeletal and Integument   Infected sebaceous cyst - Primary    Hard and tender with surrounding area of erythema No fluctuation.  Not ready for I&D at present time. Will start cefadroxil 500 mg twice a day for 7 days Reassess later in the week if not better.  May then need I&D.      Relevant Medications   doxycycline (VIBRA-TABS) 100 MG tablet     Other   Elevated blood pressure reading without diagnosis of hypertension    States blood pressure is always elevated when she comes to the doctor's office but normal at home. Advised to continue  monitoring blood pressure readings at home daily for the next several weeks and keep a log.  Advised to contact the office if numbers persistently abnormal.        Patient Instructions  Epidermoid Cyst  An epidermoid cyst, also called an epidermal cyst, is a small lump under your skin. The  cyst contains a substance called keratin. Do not try to pop or open the cyst yourself. What are the causes? A blocked hair follicle. A hair that curls and re-enters the skin instead of growing straight out of the skin. A blocked pore. Irritated skin. An injury to the skin. Certain conditions that are passed along from parent to child. Human papillomavirus (HPV). This happens rarely when cysts occur on the bottom of the feet. Long-term sun damage to the skin. What increases the risk? Having acne. Being female. Having an injury to the skin. Being past puberty. Having certain conditions caused by genes (genetic disorder) What are the signs or symptoms? These cysts are usually harmless, but they can get infected. Symptoms of infection may include: Redness. Inflammation. Tenderness. Warmth. Fever. A bad-smelling substance that drains from the cyst. Pus that drains from the cyst. How is this treated? In many cases, epidermoid cysts go away on their own without treatment. If a cyst becomes infected, treatment may include: Opening and draining the cyst, done by a doctor. After draining, you may need minor surgery to remove the rest of the cyst. Antibiotic medicine. Shots of medicines (steroids) that help to reduce inflammation. Surgery to remove the cyst. Surgery may be done if the cyst: Becomes large. Bothers you. Has a chance of turning into cancer. Do not try to open a cyst yourself. Follow these instructions at home: Medicines Take over-the-counter and prescription medicines as told by your doctor. If you were prescribed an antibiotic medicine, take it as told by your doctor. Do not stop  taking it even if you start to feel better. General instructions Keep the area around your cyst clean and dry. Wear loose, dry clothing. Avoid touching your cyst. Check your cyst every day for signs of infection. Check for: Redness, swelling, or pain. Fluid or blood. Warmth. Pus or a bad smell. Keep all follow-up visits. How is this prevented? Wear clean, dry, clothing. Avoid wearing tight clothing. Keep your skin clean and dry. Take showers or baths every day. Contact a doctor if: Your cyst has symptoms of infection. Your condition does not improve or gets worse. You have a cyst that looks different from other cysts you have had. You have a fever. Get help right away if: Redness spreads from the cyst into the area close by. Summary An epidermoid cyst is a small lump under your skin. If a cyst becomes infected, treatment may include surgery to open and drain the cyst, or to remove it. Take over-the-counter and prescription medicines only as told by your doctor. Contact a doctor if your condition is not improving or is getting worse. Keep all follow-up visits. This information is not intended to replace advice given to you by your health care provider. Make sure you discuss any questions you have with your health care provider. Document Revised: 07/05/2019 Document Reviewed: 07/05/2019 Elsevier Patient Education  2023 Elsevier Inc.      Edwina Barth, MD [facility]

## 2022-08-31 NOTE — Assessment & Plan Note (Signed)
Hard and tender with surrounding area of erythema No fluctuation.  Not ready for I&D at present time. Will start cefadroxil 500 mg twice a day for 7 days Reassess later in the week if not better.  May then need I&D.

## 2022-09-06 ENCOUNTER — Encounter: Payer: BC Managed Care – PPO | Admitting: Nurse Practitioner

## 2022-09-06 DIAGNOSIS — L089 Local infection of the skin and subcutaneous tissue, unspecified: Secondary | ICD-10-CM

## 2022-09-06 NOTE — Progress Notes (Signed)
If the area has worsened then you likely need the area to be opened and drained. Unfortunately additional antibiotics would not be recommended at this time via virtual visit.   I feel your condition warrants further evaluation and I recommend that you be seen for a face to face visit.  Please contact your primary care physician practice to be seen. Many offices offer virtual options to be seen via video if you are not comfortable going in person to a medical facility at this time.  NOTE: You will NOT be charged for this eVisit.  If you do not have a PCP, [facility] offers a free physician referral service available at [phone]. Our trained staff has the experience, knowledge and resources to put you in touch with a physician who is right for you.    If you are having a true medical emergency please call 911.   Your e-visit answers were reviewed by a board certified advanced clinical practitioner to complete your personal care plan.  Thank you for using e-Visits.

## 2022-09-06 NOTE — Progress Notes (Signed)
I have spent 5 minutes in review of e-visit questionnaire, review and updating patient chart, medical decision making and response to patient.  ° °Tesla Bochicchio W Doaa Kendzierski, NP ° °  °

## 2022-09-08 ENCOUNTER — Encounter: Payer: Self-pay | Admitting: Emergency Medicine

## 2022-09-08 ENCOUNTER — Ambulatory Visit (INDEPENDENT_AMBULATORY_CARE_PROVIDER_SITE_OTHER): Payer: BC Managed Care – PPO | Admitting: Emergency Medicine

## 2022-09-08 VITALS — BP 148/90 | HR 83 | Temp 98.7°F | Ht 71.0 in | Wt 207.6 lb

## 2022-09-08 DIAGNOSIS — Z7689 Persons encountering health services in other specified circumstances: Secondary | ICD-10-CM | POA: Insufficient documentation

## 2022-09-08 DIAGNOSIS — L723 Sebaceous cyst: Secondary | ICD-10-CM

## 2022-09-08 DIAGNOSIS — L02212 Cutaneous abscess of back [any part, except buttock]: Secondary | ICD-10-CM | POA: Diagnosis not present

## 2022-09-08 DIAGNOSIS — L089 Local infection of the skin and subcutaneous tissue, unspecified: Secondary | ICD-10-CM | POA: Diagnosis not present

## 2022-09-08 NOTE — Assessment & Plan Note (Signed)
Secondary to infected sebaceous cyst Just finished cefadroxil 500 mg twice a day Successful incision and drainage done today Advised to remove packing in 48 hours Pain management discussed Advised to contact the office if no better or worse during the next several days

## 2022-09-08 NOTE — Patient Instructions (Signed)
Incision and Drainage, Care After After incision and drainage, it is common to have: Pain or discomfort around the incision site. Blood, fluid, or pus (drainage) from the incision. Redness and firm skin around the incision site. Follow these instructions at home: Medicines Take over-the-counter and prescription medicines only as told by your health care provider. If you were prescribed antibiotics, take them as told by your provider. Do not stop using the antibiotic even if you start to feel better. Do not apply creams, ointments, or liquids unless you have been told to by your provider. Wound care Follow instructions from your provider about how to take care of your wound. Make sure you: Wash your hands with soap and water for at least 20 seconds before and after you change your bandage (dressing). If soap and water are not available, use hand sanitizer. Change your dressing and any packing as told by your provider. If the dressing is dry or stuck when you try to remove it, moisten or wet it with saline or water. This will help you remove it without harming your skin or tissues. If your wound is packed, leave it in place until your provider tells you to remove it. To remove it, moisten or wet the packing with saline or water. Leave stitches (sutures), skin glue, or tape strips in place. These skin closures may need to stay in place for 2 weeks or longer. If tape strip edges start to loosen and curl up, you may trim the loose edges. Do not remove tape strips completely unless your provider tells you to do that. Check your wound every day for signs of infection. Check for: More redness, swelling, or pain. More fluid or blood. Warmth. Pus or a bad smell. If you were sent home with a drain tube in place, follow instructions from your provider about: How to empty it. How to care for it at home. Be careful when you get rid of used dressings, wound packing, or drainage. Activity Rest the  affected area. Return to your normal activities as told by your provider. Ask your provider what activities are safe for you. General instructions Do not use any products that contain nicotine or tobacco. These products include cigarettes, chewing tobacco, and vaping devices, such as e-cigarettes. These can delay incision healing after surgery. If you need help quitting, ask your provider. Do not take baths, swim, or use a hot tub until your provider approves. Ask your provider if you may take showers. You may only be allowed to take sponge baths. The incision will keep draining. It is normal to have some clear or slightly bloody drainage. The amount of drainage should go down each day. Keep all follow-up visits. Your provider will need to make sure that your incision is healing well and that there are no problems. Your health care provider may give you more instructions. Make sure you know what you can and cannot do Contact a health care provider if: Your cyst or abscess comes back. You have any signs of infection. You notice red streaks that spread away from the incision site. You have a fever or chills. Get help right away if: You have severe pain or bleeding. You become short of breath. You have chest pain. You have signs of a severe infection. You may notice changes in your incision area, such as: Swelling that makes the skin feel hard. Numbness or tingling. Sudden increase in redness. Your skin color may change from red to purple, and then to dark  spots. Blisters, ulcers, or splitting of the skin. These symptoms may be an emergency. Get help right away. Call 911. Do not wait to see if the symptoms will go away. Do not drive yourself to the hospital. This information is not intended to replace advice given to you by your health care provider. Make sure you discuss any questions you have with your health care provider. Document Revised: 11/17/2021 Document Reviewed: 11/17/2021 Elsevier  Patient Education  2024 [employer].

## 2022-09-08 NOTE — Progress Notes (Signed)
Stacy Bray 42 y.o.   Chief Complaint  Patient presents with   Cyst    F/u. Pt states it has gotten worse. Hot to the touch    HISTORY OF PRESENT ILLNESS: [name] here for follow-up of infectious cyst.  Taking antibiotic but not getting better.  HPI   Prior to Admission medications   Medication Sig Start Date End Date Taking? Authorizing Provider  benzonatate (TESSALON) 200 MG capsule Take 1 capsule (200 mg total) by mouth 2 (two) times daily as needed for cough. 10/22/21  Yes Henson, Vickie L, NP-C  busPIRone (BUSPAR) 7.5 MG tablet TAKE 1 TABLET BY MOUTH TWICE A DAY 06/16/22  Yes Sevin Farone, Eilleen Kempf, MD  levonorgestrel (MIRENA, 52 MG,) 20 MCG/DAY IUD Mirena 20 mcg/24 hours (8 yrs) 52 mg intrauterine device  Take 1 device by intrauterine route.   Yes [provider]  levonorgestrel (MIRENA, 52 MG,) 20 MCG/DAY IUD Mirena   Yes [provider]  ondansetron (ZOFRAN) 4 MG tablet Take 1 tablet (4 mg total) by mouth every 8 (eight) hours as needed for nausea or vomiting. 12/11/21  Yes Elenore Paddy, NP    Allergies  Allergen Reactions   Azithromycin Swelling   Penicillins Hives and Other (See Comments)    Per patient Stacy Bray was young    Patient Active Problem List   Diagnosis Date Noted   Elevated blood pressure reading without diagnosis of hypertension 08/31/2022   Gastroenteritis 12/11/2021   Angio-edema 10/31/2021   Cervical radiculopathy 01/14/2021   Generalized anxiety disorder 07/03/2020   Panic attacks 07/03/2020   Essential hypertension 07/03/2020   Moderate episode of recurrent major depressive disorder (HCC) 07/03/2020   Abnormal finding on mammography 10/20/2018   Elevated liver enzymes level due to cystic fibrosis (HCC) 08/02/2018   Obesity with body mass index 30 or greater 09/14/2016   Infected sebaceous cyst 07/30/2011    Past Medical History:  Diagnosis Date   Anxiety    [employer]reesia 01/28/2020   Depression    [employer]reesia  01/28/2020   Hypertension     Past Surgical History:  Procedure Laterality Date   INTRAUTERINE DEVICE INSERTION  2009   LITHOTRIPSY  2000   kidney stones    Social History   Socioeconomic History   Marital status: Married    Spouse name: Not on file   Number of children: Not on file   Years of education: Not on file   Highest education level: Not on file  Occupational History   Not on file  Tobacco Use   Smoking status: Former    Packs/day: 1.00    Years: 20.00    Additional pack years: 0.00    Total pack years: 20.00    Types: Cigarettes    Quit date: 05/01/2011    Years since quitting: 11.3   Smokeless tobacco: Never  Vaping Use   Vaping Use: Never used  Substance and Sexual Activity   Alcohol use: Yes    Alcohol/week: 7.0 standard drinks of alcohol    Types: 7 Glasses of wine per week   Drug use: No   Sexual activity: Yes    Birth control/protection: I.U.D.  Other Topics Concern   Not on file  Social History Narrative   Not on file   Social Determinants of Health   Financial Resource Strain: Not on file  Food Insecurity: Not on file  Transportation Needs: Not on file  Physical Activity: Not on file  Stress: Not  on file  Social Connections: Not on file  Intimate Partner Violence: Not on file    Family History  Problem Relation Age of Onset   Cancer Mother        colon cancer, ovarian cancer   Cancer Maternal Aunt        melanoma, bladder cancer   Cancer Maternal Grandmother        Breast Cancer   Colon cancer Maternal Grandmother    Cancer Maternal Grandfather        colon cancer   Breast cancer Paternal Aunt    Breast cancer Paternal Grandmother      Review of Systems  Constitutional: Negative.  Negative for chills and fever.  Respiratory: Negative.  Negative for cough and shortness of breath.   Cardiovascular: Negative.  Negative for chest pain and palpitations.  Gastrointestinal:  Negative for abdominal pain, nausea and vomiting.  Skin:  Negative.  Negative for rash.       Infected sebaceous cyst  Neurological: Negative.  Negative for dizziness and headaches.  All other systems reviewed and are negative.   Vitals:   09/08/22 0909  BP: (!) 148/90  Pulse: 83  Temp: 98.7 F (37.1 C)  SpO2: 95%    Physical Exam Vitals reviewed.  Constitutional:      Appearance: Normal appearance.  HENT:     Head: Normocephalic.  Eyes:     Extraocular Movements: Extraocular movements intact.  Cardiovascular:     Rate and Rhythm: Normal rate.  Pulmonary:     Effort: Pulmonary effort is normal.  Skin:    General: Skin is warm and dry.     Comments: Infected sebaceous cyst upper back.  Visible pustule with fluctuation  Neurological:     Mental Status: Stacy Bray is alert and oriented to person, place, and time.  Psychiatric:        Mood and Affect: Mood normal.        Behavior: Behavior normal.     Procedure note: After obtaining verbal consent from patient incision and drainage procedure done. Area prepped and draped.  Skin infiltrated with 2% lidocaine.  Incision made with #15 disposable blade.  Purulent material drained. Packed with quarter inch plain gauze.  No complications.  Patient tolerated procedure well.  ASSESSMENT & PLAN: Problem List Items Addressed This Visit       Musculoskeletal and Integument   Infected sebaceous cyst   Cutaneous abscess of back excluding buttocks - Primary    Secondary to infected sebaceous cyst Just finished cefadroxil 500 mg twice a day Successful incision and drainage done today Advised to remove packing in 48 hours Pain management discussed Advised to contact the office if no better or worse during the next several days        Other   Encounter for incision and drainage procedure   Patient Instructions  Incision and Drainage, Care After After incision and drainage, it is common to have: Pain or discomfort around the incision site. Blood, fluid, or pus (drainage) from the  incision. Redness and firm skin around the incision site. Follow these instructions at home: Medicines Take over-the-counter and prescription medicines only as told by your health care provider. If you were prescribed antibiotics, take them as told by your provider. Do not stop using the antibiotic even if you start to feel better. Do not apply creams, ointments, or liquids unless you have been told to by your provider. Wound care Follow instructions from your provider about how to take care  of your wound. Make sure you: Wash your hands with soap and water for at least 20 seconds before and after you change your bandage (dressing). If soap and water are not available, use hand sanitizer. Change your dressing and any packing as told by your provider. If the dressing is dry or stuck when you try to remove it, moisten or wet it with saline or water. This will help you remove it without harming your skin or tissues. If your wound is packed, leave it in place until your provider tells you to remove it. To remove it, moisten or wet the packing with saline or water. Leave stitches (sutures), skin glue, or tape strips in place. These skin closures may need to stay in place for 2 weeks or longer. If tape strip edges start to loosen and curl up, you may trim the loose edges. Do not remove tape strips completely unless your provider tells you to do that. Check your wound every day for signs of infection. Check for: More redness, swelling, or pain. More fluid or blood. Warmth. Pus or a bad smell. If you were sent home with a drain tube in place, follow instructions from your provider about: How to empty it. How to care for it at home. Be careful when you get rid of used dressings, wound packing, or drainage. Activity Rest the affected area. Return to your normal activities as told by your provider. Ask your provider what activities are safe for you. General instructions Do not use any products that  contain nicotine or tobacco. These products include cigarettes, chewing tobacco, and vaping devices, such as e-cigarettes. These can delay incision healing after surgery. If you need help quitting, ask your provider. Do not take baths, swim, or use a hot tub until your provider approves. Ask your provider if you may take showers. You may only be allowed to take sponge baths. The incision will keep draining. It is normal to have some clear or slightly bloody drainage. The amount of drainage should go down each day. Keep all follow-up visits. Your provider will need to make sure that your incision is healing well and that there are no problems. Your health care provider may give you more instructions. Make sure you know what you can and cannot do Contact a health care provider if: Your cyst or abscess comes back. You have any signs of infection. You notice red streaks that spread away from the incision site. You have a fever or chills. Get help right away if: You have severe pain or bleeding. You become short of breath. You have chest pain. You have signs of a severe infection. You may notice changes in your incision area, such as: Swelling that makes the skin feel hard. Numbness or tingling. Sudden increase in redness. Your skin color may change from red to purple, and then to dark spots. Blisters, ulcers, or splitting of the skin. These symptoms may be an emergency. Get help right away. Call 911. Do not wait to see if the symptoms will go away. Do not drive yourself to the hospital. This information is not intended to replace advice given to you by your health care provider. Make sure you discuss any questions you have with your health care provider. Document Revised: 11/17/2021 Document Reviewed: 11/17/2021 Elsevier Patient Education  2024 [employer]     Edwina Barth, MD [facility]

## 2022-12-18 ENCOUNTER — Other Ambulatory Visit: Payer: Self-pay | Admitting: Emergency Medicine

## 2023-08-10 ENCOUNTER — Other Ambulatory Visit: Payer: Self-pay | Admitting: Emergency Medicine

## 2023-08-25 ENCOUNTER — Encounter: Payer: Self-pay | Admitting: Emergency Medicine

## 2023-08-25 ENCOUNTER — Ambulatory Visit: Admitting: Emergency Medicine

## 2023-08-25 VITALS — BP 122/84 | Temp 98.1°F | Ht 71.0 in | Wt 211.4 lb

## 2023-08-25 DIAGNOSIS — R6889 Other general symptoms and signs: Secondary | ICD-10-CM | POA: Diagnosis not present

## 2023-08-25 DIAGNOSIS — R053 Chronic cough: Secondary | ICD-10-CM | POA: Insufficient documentation

## 2023-08-25 DIAGNOSIS — J22 Unspecified acute lower respiratory infection: Secondary | ICD-10-CM | POA: Insufficient documentation

## 2023-08-25 MED ORDER — BENZONATATE 200 MG PO CAPS: 200.0000 mg | ORAL_CAPSULE | Freq: Two times a day (BID) | ORAL | 0 refills | Status: DC | PRN

## 2023-08-25 MED ORDER — HYDROCODONE BIT-HOMATROP MBR 5-1.5 MG/5ML PO SOLN: 5.0000 mL | Freq: Every evening | ORAL | 0 refills | Status: DC | PRN

## 2023-08-25 MED ORDER — DOXYCYCLINE HYCLATE 100 MG PO TABS: 100.0000 mg | ORAL_TABLET | Freq: Two times a day (BID) | ORAL | 0 refills | Status: AC

## 2023-08-25 NOTE — Assessment & Plan Note (Signed)
 Symptom management discussed Advised to rest and stay well-hydrated

## 2023-08-25 NOTE — Assessment & Plan Note (Signed)
 Clinically stable.  Upper respiratory viral infection now with secondary bacterial infection.  Recommend daily azithromycin  for 5 days. Advised to rest and stay well-hydrated Symptom management discussed Advised to contact the office if no better or worse during the next several days

## 2023-08-25 NOTE — Progress Notes (Signed)
 Stacy Bray 43 y.o.   Chief Complaint  Patient presents with   Sinus Problem    Coughing / coughing some mucus ( clear in color), some sore throat. Body chills ( hot chills). Have been using Flonase  only. Symptoms started on Saturday     HISTORY OF PRESENT ILLNESS: This is a 43 y.o. female complaining of flulike symptoms that started about 1 week ago Persistent productive cough with chest congestion and occasional sore throat Body aches and chills No other complaints or medical concerns today.  Sinus Problem Associated symptoms include chills, congestion and coughing. Pertinent negatives include no headaches.     Prior to Admission medications   Medication Sig Start Date End Date Taking? Authorizing Provider  benzonatate  (TESSALON ) 200 MG capsule Take 1 capsule (200 mg total) by mouth 2 (two) times daily as needed for cough. 10/22/21  Yes Henson, Vickie L, NP-C  busPIRone  (BUSPAR ) 7.5 MG tablet TAKE 1 TABLET BY MOUTH TWICE A DAY Patient taking differently: Take 7.5 mg by mouth 2 (two) times daily. Pt is taking once a day 08/10/23  Yes Cyana Shook, Isidro Margo, MD  levonorgestrel (MIRENA, 52 MG,) 20 MCG/DAY IUD Mirena 20 mcg/24 hours (8 yrs) 52 mg intrauterine device  Take 1 device by intrauterine route.   Yes [provider]  levonorgestrel (MIRENA, 52 MG,) 20 MCG/DAY IUD Mirena   Yes [provider]  ondansetron  (ZOFRAN ) 4 MG tablet Take 1 tablet (4 mg total) by mouth every 8 (eight) hours as needed for nausea or vomiting. 12/11/21  Yes Zorita Hiss, NP    Allergies  Allergen Reactions   Azithromycin  Swelling   Penicillins Hives and Other (See Comments)    Per patient she was young    Patient Active Problem List   Diagnosis Date Noted   Cutaneous abscess of back excluding buttocks 09/08/2022   Encounter for incision and drainage procedure 09/08/2022   Elevated blood pressure reading without diagnosis of hypertension 08/31/2022   Gastroenteritis  12/11/2021   Angio-edema 10/31/2021   Cervical radiculopathy 01/14/2021   Generalized anxiety disorder 07/03/2020   Panic attacks 07/03/2020   Essential hypertension 07/03/2020   Moderate episode of recurrent major depressive disorder (HCC) 07/03/2020   Abnormal finding on mammography 10/20/2018   Elevated liver enzymes level due to cystic fibrosis (HCC) 08/02/2018   Obesity with body mass index 30 or greater 09/14/2016   Infected sebaceous cyst 07/30/2011    Past Medical History:  Diagnosis Date   Anxiety    Phreesia 01/28/2020   Depression    [employer]reesia 01/28/2020   Hypertension     Past Surgical History:  Procedure Laterality Date   INTRAUTERINE DEVICE INSERTION  2009   LITHOTRIPSY  2000   kidney stones    Social History   Socioeconomic History   Marital status: Married    Spouse name: Not on file   Number of children: Not on file   Years of education: Not on file   Highest education level: Not on file  Occupational History   Not on file  Tobacco Use   Smoking status: Former    Current packs/day: 0.00    Average packs/day: 1 pack/day for 20.0 years (20.0 ttl pk-yrs)    Types: Cigarettes    Start date: 05/01/1991    Quit date: 05/01/2011    Years since quitting: 12.3   Smokeless tobacco: Never  Vaping Use   Vaping status: Never Used  Substance and Sexual Activity   Alcohol use: Yes  Alcohol/week: 7.0 standard drinks of alcohol    Types: 7 Glasses of wine per week   Drug use: No   Sexual activity: Yes    Birth control/protection: I.U.D.  Other Topics Concern   Not on file  Social History Narrative   Not on file   Social Drivers of Health   Financial Resource Strain: Not on file  Food Insecurity: Not on file  Transportation Needs: Not on file  Physical Activity: Not on file  Stress: Not on file  Social Connections: Not on file  Intimate Partner Violence: Not on file    Family History  Problem Relation Age of Onset   Cancer Mother        colon  cancer, ovarian cancer   Cancer Maternal Aunt        melanoma, bladder cancer   Cancer Maternal Grandmother        Breast Cancer   Colon cancer Maternal Grandmother    Cancer Maternal Grandfather        colon cancer   Breast cancer Paternal Aunt    Breast cancer Paternal Grandmother      Review of Systems  Constitutional:  Positive for chills. Negative for fever.  HENT:  Positive for congestion.   Respiratory:  Positive for cough and sputum production.   Cardiovascular:  Negative for chest pain and palpitations.  Gastrointestinal:  Negative for abdominal pain, diarrhea, nausea and vomiting.  Genitourinary: Negative.  Negative for dysuria and hematuria.  Skin: Negative.  Negative for rash.  Neurological: Negative.  Negative for dizziness and headaches.  All other systems reviewed and are negative.   Vitals:   08/25/23 1559  BP: 122/84  Temp: 98.1 F (36.7 C)    Physical Exam Vitals reviewed.  Constitutional:      Appearance: Normal appearance.  HENT:     Head: Normocephalic.     Mouth/Throat:     Mouth: Mucous membranes are moist.     Pharynx: Oropharynx is clear.  Eyes:     Extraocular Movements: Extraocular movements intact.     Conjunctiva/sclera: Conjunctivae normal.     Pupils: Pupils are equal, round, and reactive to light.  Cardiovascular:     Rate and Rhythm: Normal rate and regular rhythm.     Pulses: Normal pulses.     Heart sounds: Normal heart sounds.  Pulmonary:     Effort: Pulmonary effort is normal.     Breath sounds: Normal breath sounds.  Musculoskeletal:     Cervical back: No tenderness.  Lymphadenopathy:     Cervical: No cervical adenopathy.  Skin:    General: Skin is warm and dry.  Neurological:     Mental Status: She is alert and oriented to person, place, and time.  Psychiatric:        Mood and Affect: Mood normal.        Behavior: Behavior normal.      ASSESSMENT & PLAN: A total of 32 minutes was spent with the patient and  counseling/coordination of care regarding preparing for this visit, review of most recent office visit notes, review of chronic medical conditions under management, review of all medications, diagnosis of lower respiratory infection and need for antibiotics, symptom management, prognosis, documentation, and need for follow-up if no better or worse during the next following days.  Problem List Items Addressed This Visit       Respiratory   Lower respiratory infection - Primary   Clinically stable.  Upper respiratory viral infection now with secondary  bacterial infection.  Recommend daily azithromycin  for 5 days. Advised to rest and stay well-hydrated Symptom management discussed Advised to contact the office if no better or worse during the next several days      Relevant Medications   doxycycline  (VIBRA -TABS) 100 MG tablet     Other   Persistent cough   Cough management discussed Recommend over-the-counter Mucinex DM and cough drops Tessalon  200 mg 3 times daily Hycodan syrup at bedtime      Relevant Medications   benzonatate  (TESSALON ) 200 MG capsule   HYDROcodone  bit-homatropine (HYCODAN) 5-1.5 MG/5ML syrup   Flu-like symptoms   Symptom management discussed Advised to rest and stay well-hydrated.      Patient Instructions  Acute Bronchitis, Adult  Acute bronchitis is when air tubes in the lungs (bronchi) suddenly get swollen. The condition can make it hard for you to breathe. In adults, acute bronchitis usually goes away within 2 weeks. A cough caused by bronchitis may last up to 3 weeks. Smoking, allergies, and asthma can make the condition worse. What are the causes? Germs that cause cold and flu (viruses). The most common cause of this condition is the virus that causes the common cold. Bacteria. Substances that bother (irritate) the lungs, including: Smoke from cigarettes and other types of tobacco. Dust and pollen. Fumes from chemicals, gases, or burned fuel. Indoor  or outdoor air pollution. What increases the risk? A weak body's defense system. This is also called the immune system. Any condition that affects your lungs and breathing, such as asthma. What are the signs or symptoms? A cough. Coughing up clear, yellow, or green mucus. Making high-pitched whistling sounds when you breathe, most often when you breathe out (wheezing). Runny or stuffy nose. Having too much mucus in your lungs (chest congestion). Shortness of breath. Body aches. A sore throat. How is this treated? Acute bronchitis may go away over time without treatment. Your doctor may tell you to: Drink more fluids. This will help thin your mucus so it is easier to cough up. Use a device that gets medicine into your lungs (inhaler). Use a vaporizer or a humidifier. These are machines that add water to the air. This helps with coughing and poor breathing. Take a medicine that thins mucus and helps clear it from your lungs. Take a medicine that prevents or stops coughing. It is not common to take an antibiotic medicine for this condition. Follow these instructions at home:  Take over-the-counter and prescription medicines only as told by your doctor. Use an inhaler, vaporizer, or humidifier as told by your doctor. Take two teaspoons (10 mL) of honey at bedtime. This helps lessen your coughing at night. Drink enough fluid to keep your pee (urine) pale yellow. Do not smoke or use any products that contain nicotine or tobacco. If you need help quitting, ask your doctor. Get a lot of rest. Return to your normal activities when your doctor says that it is safe. Keep all follow-up visits. How is this prevented?  Wash your hands often with soap and water for at least 20 seconds. If you cannot use soap and water, use hand sanitizer. Avoid contact with people who have cold symptoms. Try not to touch your mouth, nose, or eyes with your hands. Avoid breathing in smoke or chemical fumes. Make  sure to get the flu shot every year. Contact a doctor if: Your symptoms do not get better in 2 weeks. You have trouble coughing up the mucus. Your cough keeps you  awake at night. You have a fever. Get help right away if: You cough up blood. You have chest pain. You have very bad shortness of breath. You faint or keep feeling like you are going to faint. You have a very bad headache. Your fever or chills get worse. These symptoms may be an emergency. Get help right away. Call your local emergency services (911 in the [place] Do not wait to see if the symptoms will go away. Do not drive yourself to the hospital. Summary Acute bronchitis is when air tubes in the lungs (bronchi) suddenly get swollen. In adults, acute bronchitis usually goes away within 2 weeks. Drink more fluids. This will help thin your mucus so it is easier to cough up. Take over-the-counter and prescription medicines only as told by your doctor. Contact a doctor if your symptoms do not improve after 2 weeks of treatment. This information is not intended to replace advice given to you by your health care provider. Make sure you discuss any questions you have with your health care provider. Document Revised: 07/31/2020 Document Reviewed: 07/31/2020 Elsevier Patient Education  2024 [employer].    Maryagnes Small, MD [facility]

## 2023-08-25 NOTE — Patient Instructions (Signed)
 Acute Bronchitis, Adult  Acute bronchitis is when air tubes in the lungs (bronchi) suddenly get swollen. The condition can make it hard for you to breathe. In adults, acute bronchitis usually goes away within 2 weeks. A cough caused by bronchitis may last up to 3 weeks. Smoking, allergies, and asthma can make the condition worse. What are the causes? Germs that cause cold and flu (viruses). The most common cause of this condition is the virus that causes the common cold. Bacteria. Substances that bother (irritate) the lungs, including: Smoke from cigarettes and other types of tobacco. Dust and pollen. Fumes from chemicals, gases, or burned fuel. Indoor or outdoor air pollution. What increases the risk? A weak body's defense system. This is also called the immune system. Any condition that affects your lungs and breathing, such as asthma. What are the signs or symptoms? A cough. Coughing up clear, yellow, or green mucus. Making high-pitched whistling sounds when you breathe, most often when you breathe out (wheezing). Runny or stuffy nose. Having too much mucus in your lungs (chest congestion). Shortness of breath. Body aches. A sore throat. How is this treated? Acute bronchitis may go away over time without treatment. Your doctor may tell you to: Drink more fluids. This will help thin your mucus so it is easier to cough up. Use a device that gets medicine into your lungs (inhaler). Use a vaporizer or a humidifier. These are machines that add water to the air. This helps with coughing and poor breathing. Take a medicine that thins mucus and helps clear it from your lungs. Take a medicine that prevents or stops coughing. It is not common to take an antibiotic medicine for this condition. Follow these instructions at home:  Take over-the-counter and prescription medicines only as told by your doctor. Use an inhaler, vaporizer, or humidifier as told by your doctor. Take two teaspoons  (10 mL) of honey at bedtime. This helps lessen your coughing at night. Drink enough fluid to keep your pee (urine) pale yellow. Do not smoke or use any products that contain nicotine or tobacco. If you need help quitting, ask your doctor. Get a lot of rest. Return to your normal activities when your doctor says that it is safe. Keep all follow-up visits. How is this prevented?  Wash your hands often with soap and water for at least 20 seconds. If you cannot use soap and water, use hand sanitizer. Avoid contact with people who have cold symptoms. Try not to touch your mouth, nose, or eyes with your hands. Avoid breathing in smoke or chemical fumes. Make sure to get the flu shot every year. Contact a doctor if: Your symptoms do not get better in 2 weeks. You have trouble coughing up the mucus. Your cough keeps you awake at night. You have a fever. Get help right away if: You cough up blood. You have chest pain. You have very bad shortness of breath. You faint or keep feeling like you are going to faint. You have a very bad headache. Your fever or chills get worse. These symptoms may be an emergency. Get help right away. Call your local emergency services (911 in the [place] Do not wait to see if the symptoms will go away. Do not drive yourself to the hospital. Summary Acute bronchitis is when air tubes in the lungs (bronchi) suddenly get swollen. In adults, acute bronchitis usually goes away within 2 weeks. Drink more fluids. This will help thin your mucus so it is easier  to cough up. Take over-the-counter and prescription medicines only as told by your doctor. Contact a doctor if your symptoms do not improve after 2 weeks of treatment. This information is not intended to replace advice given to you by your health care provider. Make sure you discuss any questions you have with your health care provider. Document Revised: 07/31/2020 Document Reviewed: 07/31/2020 Elsevier Patient  Education  2024 [employer].

## 2023-08-25 NOTE — Assessment & Plan Note (Signed)
 Cough management discussed Recommend over-the-counter Mucinex DM and cough drops Tessalon  200 mg 3 times daily Hycodan syrup at bedtime

## 2023-09-21 DIAGNOSIS — Z1231 Encounter for screening mammogram for malignant neoplasm of breast: Secondary | ICD-10-CM | POA: Diagnosis not present

## 2023-09-21 DIAGNOSIS — Z01419 Encounter for gynecological examination (general) (routine) without abnormal findings: Secondary | ICD-10-CM | POA: Diagnosis not present

## 2023-11-22 ENCOUNTER — Emergency Department (HOSPITAL_COMMUNITY)

## 2023-11-22 ENCOUNTER — Other Ambulatory Visit: Payer: Self-pay

## 2023-11-22 ENCOUNTER — Emergency Department (HOSPITAL_COMMUNITY)
Admission: EM | Admit: 2023-11-22 | Discharge: 2023-11-22 | Disposition: A | Discharge status: Home / Self Care | Attending: Emergency Medicine | Admitting: Emergency Medicine

## 2023-11-22 ENCOUNTER — Encounter (HOSPITAL_COMMUNITY): Payer: Self-pay

## 2023-11-22 DIAGNOSIS — R112 Nausea with vomiting, unspecified: Secondary | ICD-10-CM | POA: Insufficient documentation

## 2023-11-22 DIAGNOSIS — R072 Precordial pain: Secondary | ICD-10-CM | POA: Diagnosis not present

## 2023-11-22 DIAGNOSIS — R197 Diarrhea, unspecified: Secondary | ICD-10-CM | POA: Insufficient documentation

## 2023-11-22 DIAGNOSIS — R0602 Shortness of breath: Secondary | ICD-10-CM | POA: Insufficient documentation

## 2023-11-22 DIAGNOSIS — E876 Hypokalemia: Secondary | ICD-10-CM

## 2023-11-22 DIAGNOSIS — R079 Chest pain, unspecified: Secondary | ICD-10-CM | POA: Diagnosis not present

## 2023-11-22 DIAGNOSIS — R0789 Other chest pain: Secondary | ICD-10-CM | POA: Diagnosis not present

## 2023-11-22 LAB — CBC
HCT: 40.3 % (ref 36.0–46.0)
Hemoglobin: 14.5 g/dL (ref 12.0–15.0)
MCH: 35.5 pg — ABNORMAL HIGH (ref 26.0–34.0)
MCHC: 36 g/dL (ref 30.0–36.0)
MCV: 98.5 fL (ref 80.0–100.0)
Platelets: 192 K/uL (ref 150–400)
RBC: 4.09 MIL/uL (ref 3.87–5.11)
RDW: 12.3 % (ref 11.5–15.5)
WBC: 5.8 K/uL (ref 4.0–10.5)
nRBC: 0 % (ref 0.0–0.2)

## 2023-11-22 LAB — BASIC METABOLIC PANEL WITH GFR
Anion gap: 14 (ref 5–15)
BUN: 5 mg/dL — ABNORMAL LOW (ref 6–20)
CO2: 21 mmol/L — ABNORMAL LOW (ref 22–32)
Calcium: 9.7 mg/dL (ref 8.9–10.3)
Chloride: 100 mmol/L (ref 98–111)
Creatinine, Ser: 0.8 mg/dL (ref 0.44–1.00)
GFR, Estimated: >60 mL/min (ref >60)
Glucose, Bld: 127 mg/dL — ABNORMAL HIGH (ref 70–99)
Potassium: 3 mmol/L — ABNORMAL LOW (ref 3.5–5.1)
Sodium: 135 mmol/L (ref 135–145)

## 2023-11-22 LAB — HEPATIC FUNCTION PANEL
ALT: 50 U/L — ABNORMAL HIGH (ref 0–44)
AST: 40 U/L (ref 15–41)
Albumin: 3.5 g/dL (ref 3.5–5.0)
Alkaline Phosphatase: 55 U/L (ref 38–126)
Bilirubin, Direct: 0.2 mg/dL (ref 0.0–0.2)
Indirect Bilirubin: 1.4 mg/dL — ABNORMAL HIGH (ref 0.3–0.9)
Total Bilirubin: 1.6 mg/dL — ABNORMAL HIGH (ref 0.0–1.2)
Total Protein: 6.1 g/dL — ABNORMAL LOW (ref 6.5–8.1)

## 2023-11-22 LAB — TROPONIN I (HIGH SENSITIVITY)
Troponin I (High Sensitivity): 5 ng/L (ref <18)
Troponin I (High Sensitivity): 6 ng/L (ref <18)

## 2023-11-22 LAB — HCG, SERUM, QUALITATIVE: Preg, Serum: NEGATIVE

## 2023-11-22 LAB — MAGNESIUM: Magnesium: 1.3 mg/dL — ABNORMAL LOW (ref 1.7–2.4)

## 2023-11-22 LAB — LIPASE, BLOOD: Lipase: 32 U/L (ref 11–51)

## 2023-11-22 MED ORDER — ONDANSETRON HCL 4 MG/2ML IJ SOLN
4.0000 mg | Freq: Once | INTRAMUSCULAR | Status: AC
  Administered 2023-11-22 (×2): 4 mg via INTRAVENOUS
  Filled 2023-11-22: qty 2

## 2023-11-22 MED ORDER — POTASSIUM CHLORIDE CRYS ER 20 MEQ PO TBCR
40.0000 meq | EXTENDED_RELEASE_TABLET | Freq: Once | ORAL | Status: AC
  Administered 2023-11-22 (×2): 40 meq via ORAL
  Filled 2023-11-22: qty 2

## 2023-11-22 MED ORDER — LACTATED RINGERS IV BOLUS
1000.0000 mL | Freq: Once | INTRAVENOUS | Status: AC
  Administered 2023-11-22 (×2): 1000 mL via INTRAVENOUS

## 2023-11-22 MED ORDER — FAMOTIDINE 20 MG PO TABS
20.0000 mg | ORAL_TABLET | Freq: Once | ORAL | Status: AC
  Administered 2023-11-22 (×2): 20 mg via ORAL
  Filled 2023-11-22: qty 1

## 2023-11-22 MED ORDER — MAGNESIUM SULFATE 2 GM/50ML IV SOLN
2.0000 g | Freq: Once | INTRAVENOUS | Status: AC
  Administered 2023-11-22 (×2): 2 g via INTRAVENOUS
  Filled 2023-11-22: qty 50

## 2023-11-22 MED ORDER — ACETAMINOPHEN 500 MG PO TABS
1000.0000 mg | ORAL_TABLET | Freq: Once | ORAL | Status: AC
  Administered 2023-11-22 (×2): 1000 mg via ORAL
  Filled 2023-11-22: qty 2

## 2023-11-22 MED ORDER — DIAZEPAM 5 MG/ML IJ SOLN
2.5000 mg | Freq: Once | INTRAMUSCULAR | Status: AC
  Administered 2023-11-22 (×2): 2.5 mg via INTRAVENOUS
  Filled 2023-11-22: qty 2

## 2023-11-22 MED ORDER — POTASSIUM CHLORIDE CRYS ER 20 MEQ PO TBCR: EXTENDED_RELEASE_TABLET | ORAL | 0 refills | Status: DC

## 2023-11-22 MED ORDER — ALUM & MAG HYDROXIDE-SIMETH 200-200-20 MG/5ML PO SUSP
30.0000 mL | Freq: Once | ORAL | Status: AC
  Administered 2023-11-22 (×2): 30 mL via ORAL
  Filled 2023-11-22: qty 30

## 2023-11-22 NOTE — ED Notes (Signed)
 Ambulatory to b/r, steady gait, husband present.

## 2023-11-22 NOTE — ED Triage Notes (Signed)
 Patient woke up at 0300 this morning. Patient is having left sided chest pain along with shortness of breath. Patient is currently hyperventilating.

## 2023-11-22 NOTE — Discharge Instructions (Addendum)
 It was our pleasure to provide your ER care today - we hope that you feel better. Drink plenty of fluids/stay well hydrated.   If GI/reflux symptoms, try taking pepcid  and maalox as need for symptom relief.   For recent chest pain, follow up closely with cardiologist in the next 1-2 weeks - we made referral, and they should be contacting you with an appointment in the next few days.   From today's labs, your potassium level is low - eat plenty of fruits/vegetables, take potassium supplement as prescribed, and your magnesium  is also low - follow up with your doctor in 1-2 weeks.   Return to ER right away if worse, new symptoms, fevers, recurrent/persistent chest pain, increased trouble breathing, or other concern.

## 2023-11-22 NOTE — ED Notes (Signed)
 In a rush to get up to b/r to void. Slow cautious steay gait. Verbalizes unsteady. Assisted by RN and pt's husband.

## 2023-11-22 NOTE — ED Notes (Signed)
 Pt taken to xray, not room.

## 2023-11-22 NOTE — ED Provider Notes (Signed)
 [place] EMERGENCY DEPARTMENT AT [facility] Provider Note   CSN: [id number] Arrival date & time: 11/22/23  9352     Patient presents with: Chest Pain and Shortness of Breath   Stacy Bray is a 43 y.o. female.   Pt with c/o nausea, vomiting, diarrhea acute onset last night/early AM, followed by mid chest discomfort and sob. Notes several episodes of vomiting/diarrhea, and initial symptoms was nv. No bloody or bilious emesis. No melena or rectal bleeding. No constant and/or focal abd pain. No back/flank pain. No current or recent exertional chest pain. No pleuritic chest pain. No cough or uri symptoms. +heartburn/indigestion. No personal hx cad. +fam hx cad, father. No leg pain or swelling. No hx dvt or pe. No hx gallstones. Denies known ill contacts or bad food ingestion, felt fine when went to bed last night.   The history is provided by the patient, medical records and the spouse.  Chest Pain Associated symptoms: nausea, shortness of breath and vomiting   Associated symptoms: no abdominal pain, no back pain, no cough, no fever and no headache   Shortness of Breath Associated symptoms: chest pain and vomiting   Associated symptoms: no abdominal pain, no cough, no fever, no headaches, no neck pain and no sore throat        Prior to Admission medications   Medication Sig Start Date End Date Taking? Authorizing Provider  busPIRone  (BUSPAR ) 7.5 MG tablet TAKE 1 TABLET BY MOUTH TWICE A DAY 08/10/23  Yes Sagardia, Emil Schanz, MD  levonorgestrel (MIRENA, 52 MG,) 20 MCG/DAY IUD Mirena 20 mcg/24 hours (8 yrs) 52 mg intrauterine device  Take 1 device by intrauterine route.   Yes [provider]  potassium chloride  SA (KLOR-CON  M) 20 MEQ tablet One po bid x 3 days, then one po once a day 11/22/23  Yes Bernard Drivers, MD  benzonatate  (TESSALON ) 200 MG capsule Take 1 capsule (200 mg total) by mouth 2 (two) times daily as needed for cough. Patient not taking: Reported  on 11/22/2023 08/25/23   Purcell Emil Schanz, MD  HYDROcodone  bit-homatropine [facility]) 5-1.5 MG/5ML syrup Take 5 mLs by mouth at bedtime as needed for cough. Patient not taking: Reported on 11/22/2023 08/25/23   Purcell Emil Schanz, MD  ondansetron  (ZOFRAN ) 4 MG tablet Take 1 tablet (4 mg total) by mouth every 8 (eight) hours as needed for nausea or vomiting. Patient not taking: Reported on 11/22/2023 12/11/21   Elnor Lauraine BRAVO, NP    Allergies: Azithromycin , Penicillins, and Varenicline    Review of Systems  Constitutional:  Negative for chills and fever.  HENT:  Negative for sore throat.   Eyes:  Negative for redness.  Respiratory:  Positive for shortness of breath. Negative for cough.   Cardiovascular:  Positive for chest pain. Negative for leg swelling.  Gastrointestinal:  Positive for diarrhea, nausea and vomiting. Negative for abdominal pain.  Genitourinary:  Negative for dysuria and flank pain.  Musculoskeletal:  Negative for back pain and neck pain.  Neurological:  Negative for headaches.    Updated Vital Signs BP 138/81   Pulse 65   Temp 98.5 F (36.9 C) (Oral)   Resp (!) 21   SpO2 97%   Physical Exam Vitals and nursing note reviewed.  Constitutional:      Appearance: Normal appearance. She is well-developed.  HENT:     Head: Atraumatic.     Nose: Nose normal.     Mouth/Throat:     Mouth: Mucous membranes  are moist.     Pharynx: Oropharynx is clear.  Eyes:     General: No scleral icterus.    Conjunctiva/sclera: Conjunctivae normal.  Neck:     Trachea: No tracheal deviation.  Cardiovascular:     Rate and Rhythm: Normal rate and regular rhythm.     Pulses: Normal pulses.     Heart sounds: Normal heart sounds. No murmur heard.    No friction rub. No gallop.  Pulmonary:     Effort: Pulmonary effort is normal. No respiratory distress.     Breath sounds: Normal breath sounds.  Chest:     Chest wall: No tenderness.  Abdominal:     General: Bowel sounds are  normal. There is no distension.     Palpations: Abdomen is soft. There is no mass.     Tenderness: There is no abdominal tenderness. There is no guarding or rebound.     Hernia: No hernia is present.  Genitourinary:    Comments: No cva tenderness.  Musculoskeletal:        General: No swelling or tenderness.     Cervical back: Normal range of motion and neck supple. No rigidity. No muscular tenderness.     Right lower leg: No edema.     Left lower leg: No edema.  Skin:    General: Skin is warm and dry.     Findings: No rash.  Neurological:     Mental Status: She is alert.     Comments: Alert, speech normal.   Psychiatric:        Mood and Affect: Mood normal.     (all labs ordered are listed, but only abnormal results are displayed) Results for orders placed or performed during the hospital encounter of 11/22/23  Basic metabolic panel   Collection Time: 11/22/23  7:30 AM  Result Value Ref Range   Sodium 135 135 - 145 mmol/L   Potassium 3.0 (L) 3.5 - 5.1 mmol/L   Chloride 100 98 - 111 mmol/L   CO2 21 (L) 22 - 32 mmol/L   Glucose, Bld 127 (H) 70 - 99 mg/dL   BUN 5 (L) 6 - 20 mg/dL   Creatinine, Ser 9.19 0.44 - 1.00 mg/dL   Calcium 9.7 8.9 - 89.6 mg/dL   GFR, Estimated >39 >39 mL/min   Anion gap 14 5 - 15  CBC   Collection Time: 11/22/23  7:30 AM  Result Value Ref Range   WBC 5.8 4.0 - 10.5 K/uL   RBC 4.09 3.87 - 5.11 MIL/uL   Hemoglobin 14.5 12.0 - 15.0 g/dL   HCT 59.6 63.9 - 53.9 %   MCV 98.5 80.0 - 100.0 fL   MCH 35.5 (H) 26.0 - 34.0 pg   MCHC 36.0 30.0 - 36.0 g/dL   RDW 87.6 88.4 - 84.4 %   Platelets 192 150 - 400 K/uL   nRBC 0.0 0.0 - 0.2 %  Hepatic function panel   Collection Time: 11/22/23  7:30 AM  Result Value Ref Range   Total Protein 6.1 (L) 6.5 - 8.1 g/dL   Albumin 3.5 3.5 - 5.0 g/dL   AST 40 15 - 41 U/L   ALT 50 (H) 0 - 44 U/L   Alkaline Phosphatase 55 38 - 126 U/L   Total Bilirubin 1.6 (H) 0.0 - 1.2 mg/dL   Bilirubin, Direct 0.2 0.0 - 0.2 mg/dL    Indirect Bilirubin 1.4 (H) 0.3 - 0.9 mg/dL  Lipase, blood   Collection Time: 11/22/23  7:30  AM  Result Value Ref Range   Lipase 32 11 - 51 U/L  Magnesium    Collection Time: 11/22/23  7:30 AM  Result Value Ref Range   Magnesium  1.3 (L) 1.7 - 2.4 mg/dL  Troponin I (High Sensitivity)   Collection Time: 11/22/23  7:30 AM  Result Value Ref Range   Troponin I (High Sensitivity) 5 <18 ng/L  hCG, serum, qualitative   Collection Time: 11/22/23  7:42 AM  Result Value Ref Range   Preg, Serum NEGATIVE NEGATIVE  Troponin I (High Sensitivity)   Collection Time: 11/22/23  9:50 AM  Result Value Ref Range   Troponin I (High Sensitivity) 6 <18 ng/L   DG Chest 2 View Result Date: 11/22/2023 EXAM: 2 VIEW(S) XRAY OF THE CHEST 11/22/2023 07:22:00 AM COMPARISON: 07/04/2020 CLINICAL HISTORY: Chest pain. Left chest pain, shortness of breath; rover. FINDINGS: LUNGS AND PLEURA: No focal pulmonary opacity. No pulmonary edema. No pleural effusion. No pneumothorax. HEART AND MEDIASTINUM: No acute abnormality of the cardiac and mediastinal silhouettes. BONES AND SOFT TISSUES: No acute osseous abnormality. IMPRESSION: 1. No acute process. Electronically signed by: Waddell Calk MD 11/22/2023 07:40 AM EDT RP Workstation: [id number]     EKG: EKG Interpretation Date/Time:  Monday November 22 2023 06:57:31 EDT Ventricular Rate:  74 PR Interval:  127 QRS Duration:  101 QT Interval:  481 QTC Calculation: 534 R Axis:   74  Text Interpretation: Sinus rhythm No significant change since last tracing Confirmed by Bernard Drivers ([id number]) on 11/22/2023 7:04:57 AM  Radiology: [facility] Chest 2 View Result Date: 11/22/2023 EXAM: 2 VIEW(S) XRAY OF THE CHEST 11/22/2023 07:22:00 AM COMPARISON: 07/04/2020 CLINICAL HISTORY: Chest pain. Left chest pain, shortness of breath; rover. FINDINGS: LUNGS AND PLEURA: No focal pulmonary opacity. No pulmonary edema. No pleural effusion. No pneumothorax. HEART AND MEDIASTINUM: No acute abnormality of  the cardiac and mediastinal silhouettes. BONES AND SOFT TISSUES: No acute osseous abnormality. IMPRESSION: 1. No acute process. Electronically signed by: Waddell Calk MD 11/22/2023 07:40 AM EDT RP Workstation: [id number]     Procedures   Medications Ordered in the ED  lactated ringers  bolus 1,000 mL (0 mLs Intravenous Stopped 11/22/23 0811)  lactated ringers  bolus 1,000 mL (0 mLs Intravenous Stopped 11/22/23 0835)  ondansetron  (ZOFRAN ) injection 4 mg (4 mg Intravenous Given 11/22/23 0803)  acetaminophen  (TYLENOL ) tablet 1,000 mg (1,000 mg Oral Given 11/22/23 0806)  famotidine  (PEPCID ) tablet 20 mg (20 mg Oral Given 11/22/23 0806)  alum & mag hydroxide-simeth (MAALOX/MYLANTA) 200-200-20 MG/5ML suspension 30 mL (30 mLs Oral Given 11/22/23 0807)  potassium chloride  SA (KLOR-CON  M) CR tablet 40 mEq (40 mEq Oral Given 11/22/23 1100)  diazepam  (VALIUM ) injection 2.5 mg (2.5 mg Intravenous Given 11/22/23 1000)  magnesium  sulfate IVPB 2 g 50 mL (0 g Intravenous Stopped 11/22/23 1202)                                    Medical Decision Making Problems Addressed: Hypokalemia: acute illness or injury Nausea vomiting and diarrhea: acute illness or injury with systemic symptoms that poses a threat to life or bodily functions Precordial chest pain: acute illness or injury with systemic symptoms that poses a threat to life or bodily functions  Amount and/or Complexity of Data Reviewed Independent Historian: spouse    Details: hx External Data Reviewed: notes. Labs: ordered. Decision-making details documented in ED Course. Radiology: ordered and independent interpretation performed. Decision-making details documented in ED  Course. ECG/medicine tests: ordered and independent interpretation performed. Decision-making details documented in ED Course.  Risk OTC drugs. Prescription drug management. Decision regarding hospitalization.   Iv ns. Continuous pulse ox and cardiac monitoring. Labs ordered/sent.  Imaging ordered.   Differential diagnosis includes acs, gi cp, msk cp, gastroenteritis, food poisoning, etc. Dispo decision including potential need for admission considered - will get labs and imaging and reassess.   Reviewed nursing notes and prior charts for additional history. External reports reviewed. Additional history from: family.   Cardiac monitor: sinus rhythm, rate 70.  Zofran  iv. LR bolus. Acetaminophen  po, pepcid /maalox po.   Labs reviewed/interpreted by me - wbc 14, hgb 15. K sl low.  Kcl po. Mg is low. Mg iv.  Trop normal.   Pt notes hx anxiety/panic type rxn, rn feels may benefit from medication for anxiety in ed - dose ordered.   Additional labs reviewed/interpreted by me - delta trop normal and not significantly increasing - with continual symptoms since early AM, felt not c/w acs.  Xrays reviewed/interpreted by me - no pna.   Pt tolerating po. No recurrent emesis. On recheck no chest pain or abd pain. Abd soft non tender. Vitals normal. Hr 64, rr 14, pulse ox 100% room air.   Pt currently appears stable for ED d/c.   Rec close pcp/cardiology f/u.  Return precautions provided.       Final diagnoses:  Nausea vomiting and diarrhea  Precordial chest pain  Hypokalemia    ED Discharge Orders          Ordered    Ambulatory referral to Cardiology       Comments: If you have not heard from the Cardiology office within the next 72 hours please call [phone].   11/22/23 1216    potassium chloride  SA (KLOR-CON  M) 20 MEQ tablet        11/22/23 1217               Bernard Drivers, MD 11/22/23 1222

## 2023-11-22 NOTE — ED Notes (Signed)
EDP at BS. Husband at BS.  

## 2023-11-22 NOTE — ED Notes (Addendum)
 Pt alert, NAD, calm, and grimacing and grunting with L chest pain, 10/10. States, can't get a deep breath. Also reports cramping all over. States, over did it working in the yard all day, feel dehydrated. VSS. Vapes and has IUD. Denies recent surgery or travel.

## 2023-12-07 ENCOUNTER — Encounter: Payer: Self-pay | Admitting: Physician Assistant

## 2023-12-07 ENCOUNTER — Ambulatory Visit: Attending: Physician Assistant | Admitting: Physician Assistant

## 2023-12-07 VITALS — BP 140/86 | HR 95 | Ht 71.0 in | Wt 211.0 lb

## 2023-12-07 DIAGNOSIS — R072 Precordial pain: Secondary | ICD-10-CM | POA: Diagnosis not present

## 2023-12-07 DIAGNOSIS — E7849 Other hyperlipidemia: Secondary | ICD-10-CM | POA: Diagnosis not present

## 2023-12-07 DIAGNOSIS — E876 Hypokalemia: Secondary | ICD-10-CM

## 2023-12-07 DIAGNOSIS — R030 Elevated blood-pressure reading, without diagnosis of hypertension: Secondary | ICD-10-CM | POA: Diagnosis not present

## 2023-12-07 DIAGNOSIS — Z8249 Family history of ischemic heart disease and other diseases of the circulatory system: Secondary | ICD-10-CM

## 2023-12-07 NOTE — Patient Instructions (Addendum)
 Medication Instructions:  Your physician recommends that you continue on your current medications as directed. Please refer to the Current Medication list given to you today.  *If you need a refill on your cardiac medications before your next appointment, please call your pharmacy*  Lab Work: TODAY:  LPa  If you have labs (blood work) drawn today and your tests are completely normal, you will receive your results only by: MyChart Message (if you have MyChart) OR A paper copy in the mail If you have any lab test that is abnormal or we need to change your treatment, we will call you to review the results.  Testing/Procedures: Your physician recommends that you have a CT CALCIUM SCORE.   Your physician has requested that you have an echocardiogram. Echocardiography is a painless test that uses sound waves to create images of your heart. It provides your doctor with information about the size and shape of your heart and how well your heart's chambers and valves are working. This procedure takes approximately one hour. There are no restrictions for this procedure. Please do NOT wear cologne, perfume, aftershave, or lotions (deodorant is allowed). Please arrive 15 minutes prior to your appointment time.  Please note: We ask at that you not bring children with you during ultrasound (echo/ vascular) testing. Due to room size and safety concerns, children are not allowed in the ultrasound rooms during exams. Our front office staff cannot provide observation of children in our lobby area while testing is being conducted. An adult accompanying a patient to their appointment will only be allowed in the ultrasound room at the discretion of the ultrasound technician under special circumstances. We apologize for any inconvenience.    Follow-Up: At [facility], you and your health needs are our priority.  As part of our continuing mission to provide you with exceptional heart care, our providers  are all part of one team.  This team includes your primary Cardiologist (physician) and Advanced Practice Providers or APPs (Physician Assistants and Nurse Practitioners) who all work together to provide you with the care you need, when you need it.  Your next appointment:   3 month(s)  Provider:   Glendia Ferrier, PA-C          We recommend signing up for the patient portal called MyChart.  Sign up information is provided on this After Visit Summary.  MyChart is used to connect with patients for Virtual Visits (Telemedicine).  Patients are able to view lab/test results, encounter notes, upcoming appointments, etc.  Non-urgent messages can be sent to your provider as well.   To learn more about what you can do with MyChart, go to [email or web address].   Other Instructions Your physician has requested that you regularly monitor and record your blood pressure readings at home. Please use the same machine at the same time of day to check your readings and record them to bring to your follow-up visit.   Please monitor blood pressures and keep a log of your readings for 2 weeks and send them via mychart.    Make sure to check 2 hours after your medications.    AVOID these things for 30 minutes before checking your blood pressure: No Drinking caffeine. No Drinking alcohol. No Eating. No Smoking. No Exercising.   Five minutes before checking your blood pressure: Pee. Sit in a dining chair. Avoid sitting in a soft couch or armchair. Be quiet. Do not talk

## 2023-12-07 NOTE — Progress Notes (Signed)
 OFFICE NOTE:    Date:  12/07/2023  ID:  TORRANCE FRECH, DOB [birth year], MRN [id number] PCP: Purcell Emil Schanz, MD  [facility] Providers Cardiologist:  None        Elevated BP Dyslipidemia  Depression/anxiety Former smoker Family history of CAD        Discussed the use of AI scribe software for clinical note transcription with the patient, who gave verbal consent to proceed. History of Present Illness Stacy Bray is a 43 y.o. female who is referred by Bernard Drivers, MD for the evaluation of chest pain.  She was seen in the emergency room at [facility] 11/22/2023 with nausea, vomiting and diarrhea followed by mid chest discomfort and shortness of breath.  EKG dated 11/22/2023 was personally reviewed and interpreted by me today: NSR, HR 74, normal axis, no ST-T wave changes, QTc 534.  Chest x-ray was markable.  Potassium was low at 3, creatinine normal at 0.8, total protein low at 6.1, ALT elevated at 50, magnesium  low at 1.3, hemoglobin normal at 14.5, lipase normal at 32 troponin negative x 2 she was given oral potassium and IV magnesium  to replace her electrolytes.  She was given IV fluids.  She was advised to follow-up with primary care and cardiology.  She experienced sharp, knife-like chest pain during her recent emergency department visit, accompanied by dyspnea requiring oxygen. This episode was more intense than her usual panic attacks, which she has had for two years, often triggered by stress. She denies chest discomfort or dyspnea with exertion and has no leg edema. She has a history of panic attacks associated with breathlessness and stress.  She had improvement with buspirone .    She has a history of hypertension, monitored by her primary care provider, but is not on medication. She has experienced elevated blood pressure readings during stress. She does not have a home blood pressure monitor.   Her family history is significant for cardiovascular  issues, including her father's death at 54 from a heart attack and heart surgeries in her uncle and great-grandfather on her mother's side. Her mother's sister has also had a heart attack.  Socially, she consumes approximately seven alcoholic drinks per week and occasionally vapes. She works as an Therapist, occupational for an [employer] and is married with two children.    ROS-See HPI    Studies Reviewed:       HYPERTENSION CONTROL Vitals:   12/07/23 1424 12/07/23 1519  BP: (!) 142/96 (!) 140/86    The patient's blood pressure is elevated above target today.  In order to address the patient's elevated BP: Blood pressure will be monitored at home to determine if medication changes need to be made.; The blood pressure is usually elevated in clinic.  Blood pressures monitored at home have been optimal.         Physical Exam:  VS:  BP (!) 140/86   Pulse 95   Ht 5' 11 (1.803 m)   Wt 211 lb (95.7 kg)   SpO2 96%   BMI 29.43 kg/m        Wt Readings from Last 3 Encounters:  12/07/23 211 lb (95.7 kg)  08/25/23 211 lb 6 oz (95.9 kg)  09/08/22 207 lb 9.6 oz (94.2 kg)    Constitutional:      Appearance: Healthy appearance. Not in distress.  Neck:     Vascular: No carotid bruit. JVD normal.  Pulmonary:     Breath sounds: Normal breath  sounds. No wheezing. No rales.  Cardiovascular:     Normal rate. Regular rhythm.     Murmurs: There is no murmur.  Edema:    Peripheral edema absent.  Abdominal:     Palpations: Abdomen is soft.       Assessment and Plan:    Assessment & Plan Precordial chest pain  recent visit to the emergency room with chest pain.  This was in the context of nausea vomiting and diarrhea as well as some dehydration. Her symptoms do not sound cardiac. She does have risk factors for CAD with prior smoking, FHx of CAD and elevated BP. I considered setting up an ETT for screening and an echocardiogram. But with her FHx, we discussed getting a Lp(a) level and  CAC score. If these are significantly abnormal, I would opt for a nuclear stress test vs CCTA. -Obtain Echocardiogram  -Obtain CAC score -Obtain Lp(a) -If CAC score 0 or low, proceed with ETT -If CAC score very high, consider CCTA vs Stress MPI. -Follow up 3 mos. Family history of premature CAD Arrange CAC score and Lp(a). Other hyperlipidemia Chol mildly elevated in the past. If Lp(a) high or she has a high CAC burden on CT, would aim for LDL < 70. Elevated blood pressure reading without diagnosis of hypertension BP elevated. She notes white coat HTN. I have asked her to monitor BP x 2 weeks and send those for review. Hypokalemia K+, Magnesium  low in the hospital. Pt notes she has labs pending with PCP to follow up on this.          Dispo:  Return in about 3 months (around 03/08/2024) for Follow up after testing, w/ Glendia Ferrier, PA-C.  Signed, Glendia Ferrier, PA-C

## 2023-12-08 ENCOUNTER — Ambulatory Visit: Payer: Self-pay | Admitting: Physician Assistant

## 2023-12-08 LAB — LIPOPROTEIN A (LPA): Lipoprotein (a): <8.4 nmol/L (ref <75.0)

## 2023-12-13 DIAGNOSIS — Z9289 Personal history of other medical treatment: Secondary | ICD-10-CM

## 2023-12-13 HISTORY — DX: Personal history of other medical treatment: Z92.89

## 2023-12-14 NOTE — Progress Notes (Signed)
 Pt has been made aware of normal result and verbalized understanding.  jw

## 2023-12-23 ENCOUNTER — Encounter (HOSPITAL_COMMUNITY): Payer: Self-pay | Admitting: Physician Assistant

## 2024-01-03 ENCOUNTER — Ambulatory Visit (HOSPITAL_COMMUNITY)
Admission: RE | Admit: 2024-01-03 | Discharge: 2024-01-03 | Disposition: A | Discharge status: Home / Self Care | Source: Ambulatory Visit | Attending: Cardiology | Admitting: Cardiology

## 2024-01-03 ENCOUNTER — Encounter: Payer: Self-pay | Admitting: Physician Assistant

## 2024-01-03 DIAGNOSIS — R072 Precordial pain: Secondary | ICD-10-CM | POA: Insufficient documentation

## 2024-01-03 LAB — ECHOCARDIOGRAM COMPLETE
AR max vel: 3.7 cm2
AV Area VTI: 3.28 cm2
AV Area mean vel: 3.43 cm2
AV Mean grad: 3.5 mmHg
AV Peak grad: 6.4 mmHg
Ao pk vel: 1.27 m/s
Area-P 1/2: 2.56 cm2
S' Lateral: 3.7 cm

## 2024-01-07 NOTE — Progress Notes (Signed)
 Pt has been made aware of normal result and verbalized understanding.  jw

## 2024-01-12 DIAGNOSIS — Z9289 Personal history of other medical treatment: Secondary | ICD-10-CM

## 2024-01-12 HISTORY — DX: Personal history of other medical treatment: Z92.89

## 2024-01-17 ENCOUNTER — Ambulatory Visit (HOSPITAL_BASED_OUTPATIENT_CLINIC_OR_DEPARTMENT_OTHER)
Admission: RE | Admit: 2024-01-17 | Discharge: 2024-01-17 | Disposition: A | Discharge status: Home / Self Care | Payer: Self-pay | Source: Ambulatory Visit | Attending: Physician Assistant | Admitting: Physician Assistant

## 2024-01-17 DIAGNOSIS — R072 Precordial pain: Secondary | ICD-10-CM | POA: Insufficient documentation

## 2024-01-17 DIAGNOSIS — E7849 Other hyperlipidemia: Secondary | ICD-10-CM | POA: Insufficient documentation

## 2024-01-17 DIAGNOSIS — Z8249 Family history of ischemic heart disease and other diseases of the circulatory system: Secondary | ICD-10-CM | POA: Insufficient documentation

## 2024-01-18 ENCOUNTER — Encounter: Payer: Self-pay | Admitting: Physician Assistant

## 2024-02-17 ENCOUNTER — Other Ambulatory Visit: Payer: Self-pay | Admitting: Emergency Medicine

## 2024-02-25 ENCOUNTER — Encounter: Payer: Self-pay | Admitting: *Deleted

## 2024-02-28 ENCOUNTER — Ambulatory Visit: Attending: Physician Assistant | Admitting: Physician Assistant

## 2024-02-28 NOTE — Progress Notes (Deleted)
    OFFICE NOTE:    Date:  02/28/2024  ID:  KERITH SHERLEY, DOB 05/12/1980, MRN [id number] PCP: Purcell Emil Schanz, MD  [facility] Providers Cardiologist:  None { Click to update primary MD,subspecialty MD or APP then REFRESH:1}      *** Elevated BP Dyslipidemia  Lp(a) < 8.4 Depression/anxiety Former smoker Family history of CAD   CAC score 106/25: 0 TTE 01/03/2024: EF 60-65, no RWMA, normal RVSF, trivial MR         Discussed the use of AI scribe software for clinical note transcription with the patient, who gave verbal consent to proceed. History of Present Illness Chanise D Friel is a 43 y.o. female for follow up of chest pain. She was seen for chest pain in 11/2023. Her symptoms sounded noncardiac. With her significant FHx of CAD, I recommended a CAC score, TTE, Lp(a). CAC score was 0. Her TTE showed normal EF and Lp(a) was low.     ROS-See HPI***    Studies Reviewed:      *** Results  Risk Assessment/Calculations: {Does this patient have ATRIAL FIBRILLATION?:[phone]} No BP recorded.  {Refresh Note OR Click here to enter BP  :1}***      Physical Exam:  VS:  There were no vitals taken for this visit.       Wt Readings from Last 3 Encounters:  12/07/23 211 lb (95.7 kg)  08/25/23 211 lb 6 oz (95.9 kg)  09/08/22 207 lb 9.6 oz (94.2 kg)    Physical Exam***     Assessment and Plan:    Assessment & Plan Precordial chest pain Family history of premature CAD Recent CAC score 0 and Lp(a) low. *** Elevated blood pressure reading without diagnosis of hypertension  Other hyperlipidemia Lp(a) level low. *** Assessment and Plan Assessment & Plan    {      :1}    {Are you ordering a CV Procedure (e.g. stress test, cath, DCCV, TEE, etc)?   Press F2        :[id number]}  Dispo:  No follow-ups on file.  Signed, Glendia Ferrier, PA-C

## 2024-04-11 ENCOUNTER — Telehealth: Admitting: Emergency Medicine

## 2024-04-11 ENCOUNTER — Encounter: Payer: Self-pay | Admitting: Emergency Medicine

## 2024-04-11 DIAGNOSIS — F411 Generalized anxiety disorder: Secondary | ICD-10-CM

## 2024-04-11 DIAGNOSIS — K760 Fatty (change of) liver, not elsewhere classified: Secondary | ICD-10-CM | POA: Insufficient documentation

## 2024-04-11 DIAGNOSIS — Z1211 Encounter for screening for malignant neoplasm of colon: Secondary | ICD-10-CM | POA: Diagnosis not present

## 2024-04-11 MED ORDER — ESCITALOPRAM OXALATE 10 MG PO TABS: 10.0000 mg | ORAL_TABLET | Freq: Every day | ORAL | 1 refills | Status: AC

## 2024-04-11 MED ORDER — BUSPIRONE HCL 7.5 MG PO TABS: 7.5000 mg | ORAL_TABLET | Freq: Two times a day (BID) | ORAL | 1 refills | Status: AC | PRN

## 2024-04-11 NOTE — Assessment & Plan Note (Signed)
 As seen on recent CT scan of chest and upper abdomen Risks of liver cirrhosis and liver cancer associated with this condition discussed Diet and nutrition discussed Alcohol abstinence discussed Advised to follow-up with AA services Will also follow-up with GI for colon cancer screening.  Recommend to follow-up also about this condition

## 2024-04-11 NOTE — Progress Notes (Signed)
 Telemedicine Encounter- SOAP NOTE Established Patient MyChart video encounter Patient: Home  Provider: Office   Patient present only  This video encounter was conducted with the patient's (or proxy's) verbal consent via video telecommunications: yes/no: Yes Patient was instructed to have this encounter in a suitably private space; and to only have persons present to whom they give permission to participate. In addition, patient identity was confirmed by use of name plus two identifiers (DOB and address).  Chief Complaint  Patient presents with   Follow-up    My test results from calcium score test. The Dr said I had fatty liver. I want to stop drinking and need help. I had a friendtell me about a pill that helpedher to stop. Im not sleeping well. My anxiety is thru the roof. I have sleep app that tracks my sleep and says I poor sleeping could be due underlying health issues. I want to know how to take care of the fatty liver. My buspiron is coming up due refill but wondering i need to be on something else. Due to liver probs. Im barely eating.   Subjective  Stacy Bray is a 43 y.o. established patient.  Visit today to discuss recent results as follows: 1.  CT cardiac scoring done last October.  Calcium score of 0. 2.  Same scan showed hepatic steatosis.  Has questions about this 3.  Concerns about alcoholism.  Needs referral to alcohol detox programs. 4.  Generalized anxiety disorder.  Has been taking buspirone  daily.  Needs new prescriptions.  Not presently seen any psychiatrist or therapist.  Feels like her anxiety is under control.  HPI ? Patient Active Problem List   Diagnosis Date Noted   Generalized anxiety disorder 07/03/2020   Essential hypertension 07/03/2020   Moderate episode of recurrent major depressive disorder (HCC) 07/03/2020   Abnormal finding on mammography 10/20/2018   Elevated liver enzymes level due to cystic fibrosis (HCC) 08/02/2018   Obesity with body mass  index 30 or greater 09/14/2016   Past Medical History:  Diagnosis Date   Anxiety    [employer] 01/28/2020   Cardiac Calcium Score 01/2024   CAC Score 0   Depression    [employer] 01/28/2020   Hx of echocardiogram 12/2023   TTE 01/03/2024: EF 60-65, no RWMA, normal RVSF, trivial MR   Hypertension    Nephrolithiasis    Current Outpatient Medications  Medication Sig Dispense Refill   busPIRone  (BUSPAR ) 7.5 MG tablet TAKE 1 TABLET BY MOUTH TWICE A DAY 180 tablet 1   levonorgestrel (MIRENA, 52 MG,) 20 MCG/DAY IUD Mirena 20 mcg/24 hours (8 yrs) 52 mg intrauterine device  Take 1 device by intrauterine route.     No current facility-administered medications for this visit.   Allergies[1] Social History   Socioeconomic History   Marital status: Married    Spouse name: Not on file   Number of children: 2   Years of education: Not on file   Highest education level: Associate degree: occupational, scientist, product/process development, or vocational program  Occupational History   Occupation: Neurosurgeon: [employer]  Tobacco Use   Smoking status: Former    Current packs/day: 0.00    Average packs/day: 1 pack/day for 20.0 years (20.0 ttl pk-yrs)    Types: Cigarettes    Start date: 05/01/1991    Quit date: 05/01/2011    Years since quitting: 12.9   Smokeless tobacco: Never  Vaping Use   Vaping status: Some Days  Substance and Sexual  Activity   Alcohol use: Yes    Alcohol/week: 7.0 standard drinks of alcohol    Types: 7 Glasses of wine per week   Drug use: No   Sexual activity: Yes    Birth control/protection: I.U.D.  Other Topics Concern   Not on file  Social History Narrative   Not on file   Social Drivers of Health   Tobacco Use: Medium Risk (04/11/2024)   Patient History    Smoking Tobacco Use: Former    Smokeless Tobacco Use: Never    Passive Exposure: Not on file  Financial Resource Strain: Low Risk (04/08/2024)   Overall Financial Resource Strain (CARDIA)    Difficulty of  Paying Living Expenses: Not hard at all  Food Insecurity: No Food Insecurity (04/08/2024)   Epic    Worried About Programme Researcher, Broadcasting/film/video in the Last Year: Never true    Ran Out of Food in the Last Year: Never true  Transportation Needs: Unmet Transportation Needs (04/08/2024)   Epic    Lack of Transportation (Medical): No    Lack of Transportation (Non-Medical): Yes  Physical Activity: Inactive (04/08/2024)   Exercise Vital Sign    Days of Exercise per Week: 0 days    Minutes of Exercise per Session: Not on file  Stress: Stress Concern Present (04/08/2024)   [employer] of Occupational Health - Occupational Stress Questionnaire    Feeling of Stress: Very much  Social Connections: Moderately Integrated (04/08/2024)   Social Connection and Isolation Panel    Frequency of Communication with Friends and Family: Once a week    Frequency of Social Gatherings with Friends and Family: Once a week    Attends Religious Services: More than 4 times per year    Active Member of Clubs or Organizations: Yes    Attends Banker Meetings: More than 4 times per year    Marital Status: Married  Catering Manager Violence: Not on file  Depression (PHQ2-9): Low Risk (04/11/2024)   Depression (PHQ2-9)    PHQ-2 Score: 0  Alcohol Screen: Medium Risk (04/08/2024)   Alcohol Screen    Last Alcohol Screening Score (AUDIT): 13  Housing: Low Risk (04/08/2024)   Epic    Unable to Pay for Housing in the Last Year: No    Number of Times Moved in the Last Year: 0    Homeless in the Last Year: No  Utilities: Not on file  Health Literacy: Not on file   Review of Systems  Constitutional: Negative.  Negative for chills and fever.  HENT: Negative.  Negative for congestion and sore throat.   Respiratory: Negative.  Negative for cough and shortness of breath.   Cardiovascular: Negative.  Negative for chest pain and palpitations.  Gastrointestinal:  Negative for abdominal pain, diarrhea, nausea and  vomiting.  Genitourinary: Negative.  Negative for dysuria and hematuria.  Skin: Negative.  Negative for rash.  Neurological: Negative.  Negative for dizziness and headaches.  Psychiatric/Behavioral:  The patient is nervous/anxious.   All other systems reviewed and are negative.  Objective  Alert and oriented x 3 in no apparent respiratory distress  Vitals as reported by the patient: There were no vitals filed for this visit. Problem List Items Addressed This Visit       Digestive   Hepatic steatosis - Primary   As seen on recent CT scan of chest and upper abdomen Risks of liver cirrhosis and liver cancer associated with this condition discussed Diet and nutrition discussed Alcohol abstinence  discussed Advised to follow-up with AA services Will also follow-up with GI for colon cancer screening.  Recommend to follow-up also about this condition        Other   Generalized anxiety disorder   Not well-controlled and affecting quality of life. Has been taking buspirone  daily only Recommend to start Lexapro  10 mg daily Take buspirone  7.5 mg twice a day as needed only May need referral to behavioral health.      Relevant Medications   escitalopram  (LEXAPRO ) 10 MG tablet   busPIRone  (BUSPAR ) 7.5 MG tablet   Other Visit Diagnoses       Screening for colon cancer       Relevant Orders   Ambulatory referral to Gastroenterology       I discussed the assessment and treatment plan with the patient. The patient was provided an opportunity to ask questions and all were answered. The patient agreed with the plan and demonstrated an understanding of the instructions.   The patient was advised to call back or seek an in-person evaluation if the symptoms worsen or if the condition fails to improve as anticipated.    Dr. Emil Schaumann, MD [facility]    [1]  Allergies Allergen Reactions   Azithromycin  Swelling   Penicillins Hives and Other (See  Comments)    Per patient she was young   Varenicline Other (See Comments)    Other Reaction(s): hallucinations  Chantix

## 2024-04-11 NOTE — Assessment & Plan Note (Signed)
 Not well-controlled and affecting quality of life. Has been taking buspirone  daily only Recommend to start Lexapro  10 mg daily Take buspirone  7.5 mg twice a day as needed only May need referral to behavioral health.
